# Patient Record
Sex: Female | Born: 1955 | Race: White | Hispanic: No | Smoking: Former smoker
Health system: Southern US, Community
[De-identification: ages and names within clinical notes are randomized; demographics above are authoritative.]

## PROBLEM LIST (undated history)

## (undated) DIAGNOSIS — M069 Rheumatoid arthritis, unspecified: Secondary | ICD-10-CM

## (undated) DIAGNOSIS — R030 Elevated blood-pressure reading, without diagnosis of hypertension: Secondary | ICD-10-CM

## (undated) DIAGNOSIS — E781 Pure hyperglyceridemia: Secondary | ICD-10-CM

## (undated) DIAGNOSIS — K219 Gastro-esophageal reflux disease without esophagitis: Secondary | ICD-10-CM

## (undated) DIAGNOSIS — J449 Chronic obstructive pulmonary disease, unspecified: Secondary | ICD-10-CM

## (undated) DIAGNOSIS — G629 Polyneuropathy, unspecified: Secondary | ICD-10-CM

## (undated) DIAGNOSIS — R0789 Other chest pain: Secondary | ICD-10-CM

## (undated) DIAGNOSIS — R7303 Prediabetes: Secondary | ICD-10-CM

## (undated) DIAGNOSIS — G4700 Insomnia, unspecified: Secondary | ICD-10-CM

## (undated) DIAGNOSIS — R7989 Other specified abnormal findings of blood chemistry: Secondary | ICD-10-CM

## (undated) HISTORY — PX: TONSILLECTOMY: SUR1361

## (undated) HISTORY — DX: Pure hyperglyceridemia: E78.1

## (undated) HISTORY — PX: OVARIAN CYST REMOVAL: SHX89

## (undated) HISTORY — DX: Polyneuropathy, unspecified: G62.9

## (undated) HISTORY — DX: Other chest pain: R07.89

## (undated) HISTORY — DX: Other specified abnormal findings of blood chemistry: R79.89

## (undated) HISTORY — DX: Gastro-esophageal reflux disease without esophagitis: K21.9

## (undated) HISTORY — PX: TUBAL LIGATION: SHX77

## (undated) HISTORY — DX: Elevated blood-pressure reading, without diagnosis of hypertension: R03.0

## (undated) HISTORY — DX: Prediabetes: R73.03

## (undated) HISTORY — DX: Rheumatoid arthritis, unspecified: M06.9

## (undated) HISTORY — PX: APPENDECTOMY: SHX54

## (undated) HISTORY — PX: CHOLECYSTECTOMY: SHX55

## (undated) HISTORY — DX: Chronic obstructive pulmonary disease, unspecified: J44.9

## (undated) HISTORY — DX: Insomnia, unspecified: G47.00

---

## 2017-11-20 DIAGNOSIS — J449 Chronic obstructive pulmonary disease, unspecified: Secondary | ICD-10-CM | POA: Diagnosis not present

## 2017-11-20 DIAGNOSIS — M25511 Pain in right shoulder: Secondary | ICD-10-CM | POA: Diagnosis not present

## 2017-11-20 DIAGNOSIS — J019 Acute sinusitis, unspecified: Secondary | ICD-10-CM | POA: Diagnosis not present

## 2017-11-20 DIAGNOSIS — Z0000 Encounter for general adult medical examination without abnormal findings: Secondary | ICD-10-CM | POA: Diagnosis not present

## 2018-01-08 DIAGNOSIS — Z1231 Encounter for screening mammogram for malignant neoplasm of breast: Secondary | ICD-10-CM | POA: Diagnosis not present

## 2018-01-19 DIAGNOSIS — Z86010 Personal history of colonic polyps: Secondary | ICD-10-CM | POA: Diagnosis not present

## 2018-01-19 DIAGNOSIS — J449 Chronic obstructive pulmonary disease, unspecified: Secondary | ICD-10-CM | POA: Diagnosis not present

## 2018-01-19 DIAGNOSIS — R030 Elevated blood-pressure reading, without diagnosis of hypertension: Secondary | ICD-10-CM | POA: Diagnosis not present

## 2018-01-19 DIAGNOSIS — L309 Dermatitis, unspecified: Secondary | ICD-10-CM | POA: Diagnosis not present

## 2018-03-02 DIAGNOSIS — J069 Acute upper respiratory infection, unspecified: Secondary | ICD-10-CM | POA: Diagnosis not present

## 2018-03-02 DIAGNOSIS — J441 Chronic obstructive pulmonary disease with (acute) exacerbation: Secondary | ICD-10-CM | POA: Diagnosis not present

## 2018-07-22 ENCOUNTER — Encounter: Payer: Self-pay | Admitting: Cardiology

## 2018-07-22 DIAGNOSIS — Z79899 Other long term (current) drug therapy: Secondary | ICD-10-CM | POA: Diagnosis not present

## 2018-07-22 DIAGNOSIS — R5383 Other fatigue: Secondary | ICD-10-CM | POA: Diagnosis not present

## 2018-07-22 DIAGNOSIS — R0789 Other chest pain: Secondary | ICD-10-CM | POA: Diagnosis not present

## 2018-07-22 DIAGNOSIS — R7303 Prediabetes: Secondary | ICD-10-CM | POA: Diagnosis not present

## 2018-07-22 DIAGNOSIS — Z1331 Encounter for screening for depression: Secondary | ICD-10-CM | POA: Diagnosis not present

## 2018-07-22 DIAGNOSIS — E781 Pure hyperglyceridemia: Secondary | ICD-10-CM | POA: Diagnosis not present

## 2018-07-27 DIAGNOSIS — M545 Low back pain: Secondary | ICD-10-CM | POA: Diagnosis not present

## 2018-07-27 DIAGNOSIS — Z6835 Body mass index (BMI) 35.0-35.9, adult: Secondary | ICD-10-CM | POA: Diagnosis not present

## 2018-08-07 DIAGNOSIS — K219 Gastro-esophageal reflux disease without esophagitis: Secondary | ICD-10-CM

## 2018-08-07 DIAGNOSIS — R030 Elevated blood-pressure reading, without diagnosis of hypertension: Secondary | ICD-10-CM

## 2018-08-07 DIAGNOSIS — M069 Rheumatoid arthritis, unspecified: Secondary | ICD-10-CM

## 2018-08-07 DIAGNOSIS — R0789 Other chest pain: Secondary | ICD-10-CM

## 2018-08-07 DIAGNOSIS — E781 Pure hyperglyceridemia: Secondary | ICD-10-CM

## 2018-08-07 DIAGNOSIS — R079 Chest pain, unspecified: Secondary | ICD-10-CM

## 2018-08-07 DIAGNOSIS — J449 Chronic obstructive pulmonary disease, unspecified: Secondary | ICD-10-CM

## 2018-08-07 DIAGNOSIS — G629 Polyneuropathy, unspecified: Secondary | ICD-10-CM

## 2018-08-07 DIAGNOSIS — G4700 Insomnia, unspecified: Secondary | ICD-10-CM

## 2018-08-07 DIAGNOSIS — R7303 Prediabetes: Secondary | ICD-10-CM

## 2018-08-07 HISTORY — DX: Polyneuropathy, unspecified: G62.9

## 2018-08-07 HISTORY — DX: Gastro-esophageal reflux disease without esophagitis: K21.9

## 2018-08-07 HISTORY — DX: Other chest pain: R07.89

## 2018-08-07 HISTORY — DX: Prediabetes: R73.03

## 2018-08-07 HISTORY — DX: Elevated blood-pressure reading, without diagnosis of hypertension: R03.0

## 2018-08-07 HISTORY — DX: Pure hyperglyceridemia: E78.1

## 2018-08-07 HISTORY — DX: Chronic obstructive pulmonary disease, unspecified: J44.9

## 2018-08-07 HISTORY — DX: Rheumatoid arthritis, unspecified: M06.9

## 2018-08-07 HISTORY — DX: Insomnia, unspecified: G47.00

## 2018-08-07 HISTORY — DX: Chest pain, unspecified: R07.9

## 2018-08-25 DIAGNOSIS — Z23 Encounter for immunization: Secondary | ICD-10-CM | POA: Diagnosis not present

## 2018-09-05 DIAGNOSIS — G4730 Sleep apnea, unspecified: Secondary | ICD-10-CM | POA: Diagnosis not present

## 2018-09-07 NOTE — Progress Notes (Signed)
Cardiology Office Note:    Date:  09/08/2018   ID:  Morgan Stone, DOB Mar 31, 1956, MRN [id number]  PCP:  Lonie Peak, PA-C  Cardiologist:  Norman Herrlich, MD   Referring MD: Lonie Peak, PA-C  ASSESSMENT:    1. Chest pain in adult   2. Chronic obstructive pulmonary disease, unspecified COPD type (HCC)   3. High triglycerides    PLAN:    In order of problems listed above:  1. Symptoms are atypical nonanginal but it is increased cardiovascular risk.  For further evaluation we will check troponin if abnormal she need to be treated as acute coronary syndrome and a d-dimer with a history of her sister with unprovoked venous thromboembolism.  She is at risk of lung cancer have asked for a chest x-ray reviewed the options of ischemia evaluation I think she is best suited for cardiac CTA she has normal renal function I will see her back in my office 4 weeks afterwards.  I asked her to start low-dose aspirin for cardiovascular prophylaxis and shows a statin for initiate lipid-lowering therapy with concerns of CAD.  Triglycerides remain elevated at the therapy fenofibrate or Lovaza would be appropriate 2. Severe symptomatic managed by her PCP 3. Shea therapy as a statin with concerns of CAD  Next appointment 4 weeks   Medication Adjustments/Labs and Tests Ordered: Current medicines are reviewed at length with the patient today.  Concerns regarding medicines are outlined above.  No orders of the defined types were placed in this encounter.  No orders of the defined types were placed in this encounter.    Chief Complaint  Patient presents with  . Chest Pain  . Hyperlipidemia    History of Present Illness:    Morgan Stone is a 62 y.o. female who is being seen today for the evaluation of chest pain at the request of Lonie Peak, Cordelia Poche.  She has significant underlying COPD and is short of breath literally with any activity.  For the last 6 months she has been having chest pain she  describes as a diffuse mild aching through the chest nonexertional and relieved with rest no radiation little worse with a deep breath not positional and no associated GI symptoms.  Occurs more frequently and lasts for hours and at times days and occurs almost every day.  He finally sought out medical attention.  Her cardiovascular risk factors include hyperlipidemia.  Not had a chest x-ray for many years and is a former smoker and at risk for lung cancer.  History of congenital or rheumatic heart disease.  She has had no palpitation or syncope edema orthopnea.  She relates that she had a heart murmur during pregnancy at age 72 that resolved afterwards.  Recent lipid profile from PCP office showed a cholesterol of 280 with triglycerides greater than 400.   Past Medical History:  Diagnosis Date  . Atypical chest pain 08/07/2018  . COPD (chronic obstructive pulmonary disease) (HCC) 08/07/2018  . Elevated blood pressure reading 08/07/2018  . GERD (gastroesophageal reflux disease) 08/07/2018  . High triglycerides 08/07/2018  . Insomnia 08/07/2018  . Peripheral neuropathy 08/07/2018  . Pre-diabetes 08/07/2018  . Rheumatoid arthritis (HCC) 08/07/2018    Past Surgical History:  Procedure Laterality Date  . APPENDECTOMY    . CHOLECYSTECTOMY    . OVARIAN CYST REMOVAL    . TONSILLECTOMY    . TUBAL LIGATION      Current Medications: Current Meds  Medication Sig  . omeprazole (PRILOSEC) 40 MG capsule  TAKE 1 CAPSULE BY MOUTH DAILY FOR GERD  . VENTOLIN HFA 108 (90 Base) MCG/ACT inhaler Inhale 2 puffs into the lungs every 4 (four) hours as needed.     Allergies:   Patient has no known allergies.   Social History   Socioeconomic History  . Marital status: Unknown    Spouse name: Not on file  . Number of children: Not on file  . Years of education: Not on file  . Highest education level: Not on file  Occupational History  . Not on file  Social Needs  . Financial resource strain: Not on file  .  Food insecurity:    Worry: Not on file    Inability: Not on file  . Transportation needs:    Medical: Not on file    Non-medical: Not on file  Tobacco Use  . Smoking status: Former Smoker    Last attempt to quit: 10/2010    Years since quitting: 7.9  . Smokeless tobacco: Never Used  Substance and Sexual Activity  . Alcohol use: Yes    Comment: occ beer  . Drug use: Not Currently  . Sexual activity: Not on file  Lifestyle  . Physical activity:    Days per week: Not on file    Minutes per session: Not on file  . Stress: Not on file  Relationships  . Social connections:    Talks on phone: Not on file    Gets together: Not on file    Attends religious service: Not on file    Active member of club or organization: Not on file    Attends meetings of clubs or organizations: Not on file    Relationship status: Not on file  Other Topics Concern  . Not on file  Social History Narrative  . Not on file     Family History: The patient's family history includes Arthritis in her brother; Congestive Heart Failure in her mother; Glaucoma in her brother; Hypertension in her brother; Kidney disease in her mother. There is no history of Heart attack.  ROS:   Review of Systems  Constitution: Positive for malaise/fatigue and weight gain.  HENT: Positive for hearing loss.   Eyes: Negative.   Cardiovascular: Positive for chest pain and dyspnea on exertion.  Respiratory: Positive for shortness of breath.   Endocrine: Negative.   Hematologic/Lymphatic: Bruises/bleeds easily.  Skin: Negative.   Musculoskeletal: Negative.   Gastrointestinal: Negative.   Genitourinary: Negative.   Neurological: Negative.   Psychiatric/Behavioral: Negative.   Allergic/Immunologic: Negative.    Please see the history of present illness.     All other systems reviewed and are negative.  EKGs/Labs/Other Studies Reviewed:    The following studies were reviewed today: Records reviewed from her PCP  office  EKG:  EKG is  ordered today.  The ekg ordered today demonstrates sinus rhythm and is normal  Recent Labs: No results found for requested labs within last 8760 hours.  Recent Lipid Panel No results found for: CHOL, TRIG, HDL, CHOLHDL, VLDL, LDLCALC, LDLDIRECT  Physical Exam:    VS:  BP (!) 132/92 (BP Location: Left Arm, Patient Position: Sitting, Cuff Size: Large)   Pulse 89   Ht 5' (1.524 m)   Wt 184 lb 9.6 oz (83.7 kg)   SpO2 94%   BMI 36.05 kg/m     Wt Readings from Last 3 Encounters:  09/08/18 184 lb 9.6 oz (83.7 kg)     GEN:  Well nourished, well developed in no  acute distress HEENT: Normal NECK: No JVD; No carotid bruits LYMPHATICS: No lymphadenopathy CARDIAC: RRR, no murmurs, rubs, gallops RESPIRATORY:  Clear to auscultation without rales, wheezing or rhonchi mild CCJ tenderness ABDOMEN: Soft, non-tender, non-distended MUSCULOSKELETAL:  No edema; No deformity  SKIN: Warm and dry NEUROLOGIC:  Alert and oriented x 3 PSYCHIATRIC:  Normal affect     Signed, Norman Herrlich, MD  09/08/2018 10:36 AM    [facility]

## 2018-09-08 ENCOUNTER — Ambulatory Visit: Payer: BLUE CROSS/BLUE SHIELD | Admitting: Cardiology

## 2018-09-08 ENCOUNTER — Encounter: Payer: Self-pay | Admitting: Cardiology

## 2018-09-08 VITALS — BP 132/92 | HR 89 | Ht 60.0 in | Wt 184.6 lb

## 2018-09-08 DIAGNOSIS — R0602 Shortness of breath: Secondary | ICD-10-CM | POA: Diagnosis not present

## 2018-09-08 DIAGNOSIS — E781 Pure hyperglyceridemia: Secondary | ICD-10-CM

## 2018-09-08 DIAGNOSIS — R079 Chest pain, unspecified: Secondary | ICD-10-CM | POA: Diagnosis not present

## 2018-09-08 DIAGNOSIS — J449 Chronic obstructive pulmonary disease, unspecified: Secondary | ICD-10-CM | POA: Diagnosis not present

## 2018-09-08 HISTORY — DX: Shortness of breath: R06.02

## 2018-09-08 MED ORDER — METOPROLOL TARTRATE 50 MG PO TABS: 100.0000 mg | ORAL_TABLET | Freq: Once | ORAL | 0 refills | Status: DC

## 2018-09-08 MED ORDER — ASPIRIN EC 81 MG PO TBEC: 81.0000 mg | DELAYED_RELEASE_TABLET | Freq: Every day | ORAL | 3 refills | Status: DC

## 2018-09-08 MED ORDER — PRAVASTATIN SODIUM 20 MG PO TABS: 20.0000 mg | ORAL_TABLET | Freq: Every evening | ORAL | 3 refills | Status: DC

## 2018-09-08 NOTE — Patient Instructions (Addendum)
Medication Instructions:  Your physician has recommended you make the following change in your medication:   START coated aspirin 81 mg: Take 1 tablet daily START pravastatin (pravachol) 20 mg: Take 1 tablet daily in the evening  If you need a refill on your cardiac medications before your next appointment, please call your pharmacy.   Lab work: Your physician recommends that you return for lab work today: Troponin I, D-dimer. Your physician recommends that you return for lab work within 3-7 days before your cardiac CTA: BMP. No appointment needed, no need to fast.    If you have labs (blood work) drawn today and your tests are completely normal, you will receive your results only by: Marland Kitchen MyChart Message (if you have MyChart) OR . A paper copy in the mail If you have any lab test that is abnormal or we need to change your treatment, we will call you to review the results.  Testing/Procedures: You had an EKG today.  A chest x-ray takes a picture of the organs and structures inside the chest, including the heart, lungs, and blood vessels. This test can show several things, including, whether the heart is enlarges; whether fluid is building up in the lungs; and whether pacemaker / defibrillator leads are still in place.  Your physician has requested that you have cardiac CT. Cardiac computed tomography (CT) is a painless test that uses an x-ray machine to take clear, detailed pictures of your heart. For further information please visit [email or web address] Please follow instruction sheet as given.   Please arrive at the [facility] main entrance of [facility] at xx:xx AM (30-45 minutes prior to test start time)  [facility] [address] [place], [place] [address] [phone]  Proceed to the [facility] Radiology Department (First Floor).  Please follow these instructions carefully (unless otherwise directed):  On the Night Before the Test: . Be sure to Drink  plenty of water. . Do not consume any caffeinated/decaffeinated beverages or chocolate 12 hours prior to your test. . Do not take any antihistamines 12 hours prior to your test.  On the Day of the Test: . Drink plenty of water. Do not drink any water within one hour of the test. . Do not eat any food 4 hours prior to the test. . You may take your regular medications prior to the test.  . Take metoprolol (Lopressor) two hours prior to test.                  -If HR is less than 55 BPM- No Beta Blocker                -IF HR is greater than 55 BPM and patient is less than or equal to 56 yrs old Lopressor 100mg  x1.                -If HR is greater than 55 BPM and patient is greater than 8 yrs old Lopressor 50 mg x1.          After the Test: . Drink plenty of water. . After receiving IV contrast, you may experience a mild flushed feeling. This is normal. . On occasion, you may experience a mild rash up to 24 hours after the test. This is not dangerous. If this occurs, you can take Benadryl 25 mg and increase your fluid intake. . If you experience trouble breathing, this can be serious. If it is severe call 911 IMMEDIATELY. If it is  mild, please call our office.  Follow-Up: At [facility], you and your health needs are our priority.  As part of our continuing mission to provide you with exceptional heart care, we have created designated Provider Care Teams.  These Care Teams include your primary Cardiologist (physician) and Advanced Practice Providers (APPs -  Physician Assistants and Nurse Practitioners) who all work together to provide you with the care you need, when you need it. You will need a follow up appointment in 4 weeks.       Aspirin and Your Heart Aspirin is a medicine that affects the way blood clots. Aspirin can be used to help reduce the risk of blood clots, heart attacks, and other heart-related problems. Should I take aspirin? Your health care provider will help you  determine whether it is safe and beneficial for you to take aspirin daily. Taking aspirin daily may be beneficial if you:  Have had a heart attack or chest pain.  Have undergone open heart surgery such as coronary artery bypass surgery (CABG).  Have had coronary angioplasty.  Have experienced a stroke or transient ischemic attack (TIA).  Have peripheral vascular disease (PVD).  Have chronic heart rhythm problems such as atrial fibrillation.  Are there any risks of taking aspirin daily? Daily use of aspirin can increase your risk of side effects. Some of these include:  Bleeding. Bleeding problems can be minor or serious. An example of a minor problem is a cut that does not stop bleeding. An example of a more serious problem is stomach bleeding or bleeding into the brain. Your risk of bleeding is increased if you are also taking non-steroidal anti-inflammatory medicine (NSAIDs).  Increased bruising.  Upset stomach.  An allergic reaction. People who have nasal polyps have an increased risk of developing an aspirin allergy.  What are some guidelines I should follow when taking aspirin?  Take aspirin only as directed by your health care provider. Make sure you understand how much you should take and what form you should take. The two forms of aspirin are: ? Non-enteric-coated. This type of aspirin does not have a coating and is absorbed quickly. Non-enteric-coated aspirin is usually recommended for people with chest pain. This type of aspirin also comes in a chewable form. ? Enteric-coated. This type of aspirin has a special coating that releases the medicine very slowly. Enteric-coated aspirin causes less stomach upset than non-enteric-coated aspirin. This type of aspirin should not be chewed or crushed.  Drink alcohol in moderation. Drinking alcohol increases your risk of bleeding. When should I seek medical care?  You have unusual bleeding or bruising.  You have stomach  pain.  You have an allergic reaction. Symptoms of an allergic reaction include: ? Hives. ? Itchy skin. ? Swelling of the lips, tongue, or face.  You have ringing in your ears. When should I seek immediate medical care?  Your bowel movements are bloody, dark red, or black in color.  You vomit or cough up blood.  You have blood in your urine.  You cough, wheeze, or feel short of breath. If you have any of the following symptoms, this is an emergency. Do not wait to see if the pain will go away. Get medical help at once. Call your local emergency services (911 in the [place] Do not drive yourself to the hospital.  You have severe chest pain, especially if the pain is crushing or pressure-like and spreads to the arms, back, neck, or jaw.  You have stroke-like  symptoms, such as: ? Loss of vision. ? Difficulty talking. ? Numbness or weakness on one side of your body. ? Numbness or weakness in your arm or leg. ? Not thinking clearly or feeling confused.  This information is not intended to replace advice given to you by your health care provider. Make sure you discuss any questions you have with your health care provider. Document Released: 10/03/2008 Document Revised: 02/28/2016 Document Reviewed: 01/26/2014 Elsevier Interactive Patient Education  2018 [employer].    Pravastatin tablets What is this medicine? PRAVASTATIN (PRA va stat in) is known as a HMG-CoA reductase inhibitor or 'statin'. It lowers the level of cholesterol and triglycerides in the blood. This drug may also reduce the risk of heart attack, stroke, or other health problems in patients with risk factors for heart disease. Diet and lifestyle changes are often used with this drug. This medicine may be used for other purposes; ask your health care provider or pharmacist if you have questions. COMMON BRAND NAME(S): Pravachol What should I tell my health care provider before I take this medicine? They need to know if  you have any of these conditions: -frequently drink alcoholic beverages -kidney disease -liver disease -muscle aches or weakness -other medical condition -an unusual or allergic reaction to pravastatin, other medicines, foods, dyes, or preservatives -pregnant or trying to get pregnant -breast-feeding How should I use this medicine? Take pravastatin tablets by mouth. Swallow the tablets with a drink of water. Pravastatin can be taken at anytime of the day, with or without food. Follow the directions on the prescription label. Take your doses at regular intervals. Do not take your medicine more often than directed. Talk to your pediatrician regarding the use of this medicine in children. Special care may be needed. Pravastatin has been used in children as young as 69 years of age. Overdosage: If you think you have taken too much of this medicine contact a poison control center or emergency room at once. NOTE: This medicine is only for you. Do not share this medicine with others. What if I miss a dose? If you miss a dose, take it as soon as you can. If it is almost time for your next dose, take only that dose. Do not take double or extra doses. What may interact with this medicine? This medicine may interact with the following medications: -colchicine -cyclosporine -other medicines for high cholesterol -some antibiotics like azithromycin, clarithromycin, erythromycin, and telithromycin This list may not describe all possible interactions. Give your health care provider a list of all the medicines, herbs, non-prescription drugs, or dietary supplements you use. Also tell them if you smoke, drink alcohol, or use illegal drugs. Some items may interact with your medicine. What should I watch for while using this medicine? Visit your doctor or health care professional for regular check-ups. You may need regular tests to make sure your liver is working properly. Tell your doctor or health care  professional right away if you get any unexplained muscle pain, tenderness, or weakness, especially if you also have a fever and tiredness. Your doctor or health care professional may tell you to stop taking this medicine if you develop muscle problems. If your muscle problems do not go away after stopping this medicine, contact your health care professional. This drug is only part of a total heart-health program. Your doctor or a dietician can suggest a low-cholesterol and low-fat diet to help. Avoid alcohol and smoking, and keep a proper exercise schedule. Do not use  this drug if you are pregnant or breast-feeding. Serious side effects to an unborn child or to an infant are possible. Talk to your doctor or pharmacist for more information. This medicine may affect blood sugar levels. If you have diabetes, check with your doctor or health care professional before you change your diet or the dose of your diabetic medicine. If you are going to have surgery tell your health care professional that you are taking this drug. What side effects may I notice from receiving this medicine? Side effects that you should report to your doctor or health care professional as soon as possible: -allergic reactions like skin rash, itching or hives, swelling of the face, lips, or tongue -dark urine -fever -muscle pain, cramps, or weakness -redness, blistering, peeling or loosening of the skin, including inside the mouth -trouble passing urine or change in the amount of urine -unusually weak or tired -yellowing of the eyes or skin Side effects that usually do not require medical attention (report to your doctor or health care professional if they continue or are bothersome): -gas -headache -heartburn -indigestion -stomach pain This list may not describe all possible side effects. Call your doctor for medical advice about side effects. You may report side effects to FDA at 1-800-FDA-1088. Where should I keep my  medicine? Keep out of the reach of children. Store at room temperature between 15 to 30 degrees C (59 to 86 degrees F). Protect from light. Keep container tightly closed. Throw away any unused medicine after the expiration date. NOTE: This sheet is a summary. It may not cover all possible information. If you have questions about this medicine, talk to your doctor, pharmacist, or health care provider.  2018 Elsevier/Gold Standard (2015-05-18 16:00:27)     Cardiac CT Angiogram A cardiac CT angiogram is a procedure to look at the heart and the area around the heart. It may be done to help find the cause of chest pains or other symptoms of heart disease. During this procedure, a large X-ray machine, called a CT scanner, takes detailed pictures of the heart and the surrounding area after a dye (contrast material) has been injected into blood vessels in the area. The procedure is also sometimes called a coronary CT angiogram, coronary artery scanning, or CTA. A cardiac CT angiogram allows the health care provider to see how well blood is flowing to and from the heart. The health care provider will be able to see if there are any problems, such as:  Blockage or narrowing of the coronary arteries in the heart.  Fluid around the heart.  Signs of weakness or disease in the muscles, valves, and tissues of the heart.  Tell a health care provider about:  Any allergies you have. This is especially important if you have had a previous allergic reaction to contrast dye.  All medicines you are taking, including vitamins, herbs, eye drops, creams, and over-the-counter medicines.  Any blood disorders you have.  Any surgeries you have had.  Any medical conditions you have.  Whether you are pregnant or may be pregnant.  Any anxiety disorders, chronic pain, or other conditions you have that may increase your stress or prevent you from lying still. What are the risks? Generally, this is a safe  procedure. However, problems may occur, including:  Bleeding.  Infection.  Allergic reactions to medicines or dyes.  Damage to other structures or organs.  Kidney damage from the dye or contrast that is used.  Increased risk of cancer from  radiation exposure. This risk is low. Talk with your health care provider about: ? The risks and benefits of testing. ? How you can receive the lowest dose of radiation.  What happens before the procedure?  Wear comfortable clothing and remove any jewelry, glasses, dentures, and hearing aids.  Follow instructions from your health care provider about eating and drinking. This may include: ? For 12 hours before the test - avoid caffeine. This includes tea, coffee, soda, energy drinks, and diet pills. Drink plenty of water or other fluids that do not have caffeine in them. Being well-hydrated can prevent complications. ? For 4-6 hours before the test - stop eating and drinking. The contrast dye can cause nausea, but this is less likely if your stomach is empty.  Ask your health care provider about changing or stopping your regular medicines. This is especially important if you are taking diabetes medicines, blood thinners, or medicines to treat erectile dysfunction. What happens during the procedure?  Hair on your chest may need to be removed so that small sticky patches called electrodes can be placed on your chest. These will transmit information that helps to monitor your heart during the test.  An IV tube will be inserted into one of your veins.  You might be given a medicine to control your heart rate during the test. This will help to ensure that good images are obtained.  You will be asked to lie on an exam table. This table will slide in and out of the CT machine during the procedure.  Contrast dye will be injected into the IV tube. You might feel warm, or you may get a metallic taste in your mouth.  You will be given a medicine  (nitroglycerin) to relax (dilate) the arteries in your heart.  The table that you are lying on will move into the CT machine tunnel for the scan.  The person running the machine will give you instructions while the scans are being done. You may be asked to: ? Keep your arms above your head. ? Hold your breath. ? Stay very still, even if the table is moving.  When the scanning is complete, you will be moved out of the machine.  The IV tube will be removed. The procedure may vary among health care providers and hospitals. What happens after the procedure?  You might feel warm, or you may get a metallic taste in your mouth from the contrast dye.  You may have a headache from the nitroglycerin.  After the procedure, drink water or other fluids to wash (flush) the contrast material out of your body.  Contact a health care provider if you have any symptoms of allergy to the contrast. These symptoms include: ? Shortness of breath. ? Rash or hives. ? A racing heartbeat.  Most people can return to their normal activities right after the procedure. Ask your health care provider what activities are safe for you.  It is up to you to get the results of your procedure. Ask your health care provider, or the department that is doing the procedure, when your results will be ready. Summary  A cardiac CT angiogram is a procedure to look at the heart and the area around the heart. It may be done to help find the cause of chest pains or other symptoms of heart disease.  During this procedure, a large X-ray machine, called a CT scanner, takes detailed pictures of the heart and the surrounding area after a dye (contrast  material) has been injected into blood vessels in the area.  Ask your health care provider about changing or stopping your regular medicines before the procedure. This is especially important if you are taking diabetes medicines, blood thinners, or medicines to treat erectile  dysfunction.  After the procedure, drink water or other fluids to wash (flush) the contrast material out of your body. This information is not intended to replace advice given to you by your health care provider. Make sure you discuss any questions you have with your health care provider. Document Released: 10/03/2008 Document Revised: 09/09/2016 Document Reviewed: 09/09/2016 Elsevier Interactive Patient Education  2017 [employer].

## 2018-09-11 ENCOUNTER — Telehealth: Payer: Self-pay | Admitting: *Deleted

## 2018-09-11 ENCOUNTER — Encounter: Payer: Self-pay | Admitting: *Deleted

## 2018-09-11 DIAGNOSIS — R9389 Abnormal findings on diagnostic imaging of other specified body structures: Secondary | ICD-10-CM | POA: Insufficient documentation

## 2018-09-11 NOTE — Telephone Encounter (Signed)
Patient informed of chest x-ray results and is agreeable to have a CT chest w/o contrast. Advised patient after insurance approval is processed, she will be contacted to schedule testing. Patient verbalized understanding. No further questions.

## 2018-09-11 NOTE — Telephone Encounter (Signed)
-----   Message from Baldo Daub, MD sent at 09/10/2018  8:17 AM EST ----- Abnormal needs non contrast CT chest

## 2018-09-17 ENCOUNTER — Telehealth: Payer: Self-pay

## 2018-09-17 LAB — SPECIMEN STATUS REPORT

## 2018-09-17 NOTE — Telephone Encounter (Signed)
Entered in error

## 2018-09-18 ENCOUNTER — Telehealth: Payer: Self-pay | Admitting: Cardiology

## 2018-09-18 DIAGNOSIS — R7989 Other specified abnormal findings of blood chemistry: Secondary | ICD-10-CM

## 2018-09-18 HISTORY — DX: Other specified abnormal findings of blood chemistry: R79.89

## 2018-09-18 LAB — D-DIMER, QUANTITATIVE: D-DIMER: 0.74 mg/L FEU — ABNORMAL HIGH (ref 0.00–0.49)

## 2018-09-18 LAB — TROPONIN I

## 2018-09-18 NOTE — Addendum Note (Signed)
Addended by: Lamona Curl on: 09/18/2018 03:12 PM   Modules accepted: Orders

## 2018-09-18 NOTE — Telephone Encounter (Signed)
Advised patient that she will no longer need CT chest as she will have that done during Cardiac CTA on 09-28-18.    Patient notified of elevated d dimer per Dr Dulce Sellar.  Pt is scheduled for VQ scan at [facility] on 09-21-18.  Patient verbalized understanding.

## 2018-09-18 NOTE — Telephone Encounter (Signed)
Patient states that she was told that she can get her scan done cheaper at the Imaging center and would like to Schedule there and we can go ahead and schedule for her.Marland Kitchen

## 2018-09-18 NOTE — Telephone Encounter (Signed)
Left message for patient to return call.

## 2018-09-21 DIAGNOSIS — R0602 Shortness of breath: Secondary | ICD-10-CM | POA: Diagnosis not present

## 2018-09-21 DIAGNOSIS — R079 Chest pain, unspecified: Secondary | ICD-10-CM | POA: Diagnosis not present

## 2018-09-21 DIAGNOSIS — R7989 Other specified abnormal findings of blood chemistry: Secondary | ICD-10-CM | POA: Diagnosis not present

## 2018-09-22 LAB — BASIC METABOLIC PANEL
BUN / CREAT RATIO: 13 (ref 12–28)
BUN: 11 mg/dL (ref 8–27)
CO2: 19 mmol/L — AB (ref 20–29)
CREATININE: 0.86 mg/dL (ref 0.57–1.00)
Calcium: 9.9 mg/dL (ref 8.7–10.3)
Chloride: 104 mmol/L (ref 96–106)
GFR calc Af Amer: 83 mL/min/{1.73_m2} (ref >59)
GFR calc non Af Amer: 72 mL/min/{1.73_m2} (ref >59)
GLUCOSE: 111 mg/dL — AB (ref 65–99)
POTASSIUM: 3.9 mmol/L (ref 3.5–5.2)
SODIUM: 143 mmol/L (ref 134–144)

## 2018-09-25 ENCOUNTER — Other Ambulatory Visit: Payer: Self-pay | Admitting: *Deleted

## 2018-09-25 ENCOUNTER — Telehealth: Payer: Self-pay | Admitting: Cardiology

## 2018-09-25 DIAGNOSIS — R7989 Other specified abnormal findings of blood chemistry: Secondary | ICD-10-CM

## 2018-09-28 ENCOUNTER — Ambulatory Visit (HOSPITAL_COMMUNITY): Payer: BLUE CROSS/BLUE SHIELD

## 2018-09-28 ENCOUNTER — Ambulatory Visit (HOSPITAL_COMMUNITY)
Admission: RE | Admit: 2018-09-28 | Discharge: 2018-09-28 | Disposition: A | Discharge status: Home / Self Care | Payer: BLUE CROSS/BLUE SHIELD | Source: Ambulatory Visit | Attending: Cardiology | Admitting: Cardiology

## 2018-09-28 ENCOUNTER — Encounter (HOSPITAL_COMMUNITY): Payer: Self-pay

## 2018-09-28 DIAGNOSIS — R079 Chest pain, unspecified: Secondary | ICD-10-CM | POA: Insufficient documentation

## 2018-09-28 MED ORDER — NITROGLYCERIN 0.4 MG SL SUBL
0.8000 mg | SUBLINGUAL_TABLET | Freq: Once | SUBLINGUAL | Status: AC
  Administered 2018-09-28: 0.8 mg via SUBLINGUAL

## 2018-09-28 MED ORDER — IOPAMIDOL (ISOVUE-370) INJECTION 76%
80.0000 mL | Freq: Once | INTRAVENOUS | Status: AC | PRN
  Administered 2018-09-28: 80 mL via INTRAVENOUS

## 2018-09-28 MED ORDER — METOPROLOL TARTRATE 5 MG/5ML IV SOLN
5.0000 mg | INTRAVENOUS | Status: DC | PRN
  Administered 2018-09-28: 5 mg via INTRAVENOUS

## 2018-09-28 MED ORDER — NITROGLYCERIN 0.4 MG SL SUBL
SUBLINGUAL_TABLET | SUBLINGUAL | Status: AC
  Administered 2018-09-28: 0.8 mg via SUBLINGUAL
  Filled 2018-09-28: qty 2

## 2018-09-28 MED ORDER — METOPROLOL TARTRATE 5 MG/5ML IV SOLN
INTRAVENOUS | Status: AC
  Administered 2018-09-28: 5 mg via INTRAVENOUS
  Filled 2018-09-28: qty 5

## 2018-09-29 ENCOUNTER — Telehealth: Payer: Self-pay

## 2018-09-29 NOTE — Telephone Encounter (Signed)
Patient notified of normal with no CAD seen on cardiac CTA per Dr Dulce Sellar.  Patient verbalized understanding.

## 2018-10-05 NOTE — Telephone Encounter (Signed)
Done

## 2018-10-07 NOTE — Progress Notes (Signed)
Cardiology Office Note:    Date:  10/08/2018   ID:  Morgan Stone, DOB [birth year], MRN [id number]  PCP:  Lonie Peak, PA-C  Cardiologist:  Norman Herrlich, MD    Referring MD: Lonie Peak, PA-C    ASSESSMENT:    1. Chronic obstructive pulmonary disease, unspecified COPD type (HCC)   2. Chest pain in adult   3. Costochondritis    PLAN:    In order of problems listed above:  1. By her primary care physician continue current treatment 2. After extensive evaluation no evidence of heart underlying heart disease or pulmonary embolism.  Clinically she has chronic costochondral playing a place her nonsteroidal anti-inflammatory drug and if unimproved referral to physical therapy modalities iontophoresis of high potency steroid to the chest wall she is reassured.   Next appointment: As needed   Medication Adjustments/Labs and Tests Ordered: Current medicines are reviewed at length with the patient today.  Concerns regarding medicines are outlined above.  No orders of the defined types were placed in this encounter.  Meds ordered this encounter  Medications  . celecoxib (CELEBREX) 100 MG capsule    Sig: Take 1 capsule (100 mg total) by mouth 2 (two) times daily.    Dispense:  30 capsule    Refill:  1    No chief complaint on file.   History of Present Illness:    Morgan Stone is a 62 y.o. female with a hx of COPD hyperlipidemia and chest pain last seen 09/08/18 for chest pain.D Dimer was normal for age and Troponin undetectable.cardiac CTA was normal. Perfusion lung scan was normal.  IMPRESSION: 1. Coronary calcium score of 0.72. This was 57th percentile for age and sex matched control. 2. Normal coronary origin with right dominance. 3. No evidence of obstructive CAD. Compliance with diet, lifestyle and medications: Yes  She continues to have sternal pain she describes it as ache but has a positional component is reproducible palpating costochondral junction bilaterally.   Clinically after extensive evaluation she has chronic costochondral pain.  She is reassured.  She also has reflux well controlled with PPI and I will place her on a nonsteroidal anti-inflammatory drug to break her pain cycle and if ineffective referral to physical therapy modalities.  COPD is stable she has no severe shortness of breath no exertional chest pain palpitation or syncope. Past Medical History:  Diagnosis Date  . Atypical chest pain 08/07/2018  . COPD (chronic obstructive pulmonary disease) (HCC) 08/07/2018  . Elevated blood pressure reading 08/07/2018  . Elevated d-dimer 09/18/2018  . GERD (gastroesophageal reflux disease) 08/07/2018  . High triglycerides 08/07/2018  . Insomnia 08/07/2018  . Peripheral neuropathy 08/07/2018  . Pre-diabetes 08/07/2018  . Rheumatoid arthritis (HCC) 08/07/2018    Past Surgical History:  Procedure Laterality Date  . APPENDECTOMY    . CHOLECYSTECTOMY    . OVARIAN CYST REMOVAL    . TONSILLECTOMY    . TUBAL LIGATION      Current Medications: Current Meds  Medication Sig  . aspirin EC 81 MG tablet Take 1 tablet (81 mg total) by mouth daily.  . meloxicam (MOBIC) 15 MG tablet TAKE 1 TABLET BY MOUTH EVERY DAY FOR BACK PAIN  . omeprazole (PRILOSEC) 40 MG capsule TAKE 1 CAPSULE BY MOUTH DAILY FOR GERD  . VENTOLIN HFA 108 (90 Base) MCG/ACT inhaler Inhale 2 puffs into the lungs every 4 (four) hours as needed.     Allergies:   Patient has no known allergies.   Social History  Socioeconomic History  . Marital status: Divorced    Spouse name: Not on file  . Number of children: Not on file  . Years of education: Not on file  . Highest education level: Not on file  Occupational History  . Not on file  Social Needs  . Financial resource strain: Not on file  . Food insecurity:    Worry: Not on file    Inability: Not on file  . Transportation needs:    Medical: Not on file    Non-medical: Not on file  Tobacco Use  . Smoking status: Former Smoker      Last attempt to quit: 10/2010    Years since quitting: 8.0  . Smokeless tobacco: Never Used  Substance and Sexual Activity  . Alcohol use: Yes    Comment: occ beer  . Drug use: Not Currently  . Sexual activity: Not on file  Lifestyle  . Physical activity:    Days per week: Not on file    Minutes per session: Not on file  . Stress: Not on file  Relationships  . Social connections:    Talks on phone: Not on file    Gets together: Not on file    Attends religious service: Not on file    Active member of club or organization: Not on file    Attends meetings of clubs or organizations: Not on file    Relationship status: Not on file  Other Topics Concern  . Not on file  Social History Narrative  . Not on file     Family History: The patient's family history includes Arthritis in her brother; Congestive Heart Failure in her mother; Deep vein thrombosis in her sister; Glaucoma in her brother; Hypertension in her brother; Kidney disease in her mother. There is no history of Heart attack. ROS:   Please see the history of present illness.    All other systems reviewed and are negative.  EKGs/Labs/Other Studies Reviewed:    The following studies were reviewed today:  Recent Labs: 09/21/2018: BUN 11; Creatinine, Ser 0.86; Potassium 3.9; Sodium 143  Recent Lipid Panel No results found for: CHOL, TRIG, HDL, CHOLHDL, VLDL, LDLCALC, LDLDIRECT  Physical Exam:    VS:  BP 134/84 (BP Location: Right Arm, Patient Position: Sitting, Cuff Size: Large)   Pulse 88   Ht 5' (1.524 m)   Wt 182 lb (82.6 kg)   SpO2 97%   BMI 35.54 kg/m     Wt Readings from Last 3 Encounters:  10/08/18 182 lb (82.6 kg)  09/08/18 184 lb 9.6 oz (83.7 kg)     GEN:  Well nourished, well developed in no acute distress HEENT: Normal NECK: No JVD; No carotid bruits LYMPHATICS: No lymphadenopathy CARDIAC: tender CCJ bilaterally tender reproduces her complaint RRR, no murmurs, rubs, gallops RESPIRATORY:   Clear to auscultation without rales, wheezing or rhonchi  ABDOMEN: Soft, non-tender, non-distended MUSCULOSKELETAL:  No edema; No deformity  SKIN: Warm and dry NEUROLOGIC:  Alert and oriented x 3 PSYCHIATRIC:  Normal affect    Signed, Norman Herrlich, MD  10/08/2018 2:45 PM    [facility]

## 2018-10-08 ENCOUNTER — Ambulatory Visit (INDEPENDENT_AMBULATORY_CARE_PROVIDER_SITE_OTHER): Payer: BLUE CROSS/BLUE SHIELD | Admitting: Cardiology

## 2018-10-08 ENCOUNTER — Encounter: Payer: Self-pay | Admitting: Cardiology

## 2018-10-08 VITALS — BP 134/84 | HR 88 | Ht 60.0 in | Wt 182.0 lb

## 2018-10-08 DIAGNOSIS — J449 Chronic obstructive pulmonary disease, unspecified: Secondary | ICD-10-CM

## 2018-10-08 DIAGNOSIS — R079 Chest pain, unspecified: Secondary | ICD-10-CM | POA: Diagnosis not present

## 2018-10-08 DIAGNOSIS — M940 Chondrocostal junction syndrome [Tietze]: Secondary | ICD-10-CM | POA: Diagnosis not present

## 2018-10-08 HISTORY — DX: Chondrocostal junction syndrome (tietze): M94.0

## 2018-10-08 MED ORDER — CELECOXIB 100 MG PO CAPS: 100.0000 mg | ORAL_CAPSULE | Freq: Two times a day (BID) | ORAL | 1 refills | Status: DC

## 2018-10-08 NOTE — Patient Instructions (Addendum)
Medication Instructions:  Your physician has recommended you make the following change in your medication:  START celecoxib (celebrex) 100 mg: Take 1 tablet twice daily for 2 weeks. If needed, you can take for another 2 weeks. If this medication does not help, please contact our office for further recommendations.   If you need a refill on your cardiac medications before your next appointment, please call your pharmacy.   Lab work: None  If you have labs (blood work) drawn today and your tests are completely normal, you will receive your results only by: Marland Kitchen MyChart Message (if you have MyChart) OR . A paper copy in the mail If you have any lab test that is abnormal or we need to change your treatment, we will call you to review the results.  Testing/Procedures: None  Follow-Up: At [facility], you and your health needs are our priority.  As part of our continuing mission to provide you with exceptional heart care, we have created designated Provider Care Teams.  These Care Teams include your primary Cardiologist (physician) and Advanced Practice Providers (APPs -  Physician Assistants and Nurse Practitioners) who all work together to provide you with the care you need, when you need it. You will need a follow up appointment as needed if symptoms worsen or fail to improve.        Costochondritis Costochondritis is swelling and irritation (inflammation) of the tissue (cartilage) that connects your ribs to your breastbone (sternum). This causes pain in the front of your chest. Usually, the pain:  Starts gradually.  Is in more than one rib.  This condition usually goes away on its own over time. Follow these instructions at home:  Do not do anything that makes your pain worse.  If directed, put ice on the painful area: ? Put ice in a plastic bag. ? Place a towel between your skin and the bag. ? Leave the ice on for 20 minutes, 2-3 times a day.  If directed, put heat on the  affected area as often as told by your doctor. Use the heat source that your doctor tells you to use, such as a moist heat pack or a heating pad. ? Place a towel between your skin and the heat source. ? Leave the heat on for 20-30 minutes. ? Take off the heat if your skin turns bright red. This is very important if you cannot feel pain, heat, or cold. You may have a greater risk of getting burned.  Take over-the-counter and prescription medicines only as told by your doctor.  Return to your normal activities as told by your doctor. Ask your doctor what activities are safe for you.  Keep all follow-up visits as told by your doctor. This is important. Contact a doctor if:  You have chills or a fever.  Your pain does not go away or it gets worse.  You have a cough that does not go away. Get help right away if:  You are short of breath. This information is not intended to replace advice given to you by your health care provider. Make sure you discuss any questions you have with your health care provider. Document Released: 04/08/2008 Document Revised: 05/10/2016 Document Reviewed: 02/14/2016 Elsevier Interactive Patient Education  2018 [employer].     Celecoxib capsules What is this medicine? CELECOXIB (sell a KOX ib) is a non-steroidal anti-inflammatory drug (NSAID). This medicine is used to treat arthritis and ankylosing spondylitis. It may be also used for pain or  painful monthly periods. This medicine may be used for other purposes; ask your health care provider or pharmacist if you have questions. COMMON BRAND NAME(S): Celebrex What should I tell my health care provider before I take this medicine? They need to know if you have any of these conditions: -asthma -coronary artery bypass graft (CABG) surgery within the past 2 weeks -drink more than 3 alcohol-containing drinks a day -heart disease or circulation problems like heart failure or leg edema (fluid retention) -high  blood pressure -kidney disease -liver disease -stomach bleeding or ulcers -an unusual or allergic reaction to celecoxib, sulfa drugs, aspirin, other NSAIDs, other medicines, foods, dyes, or preservatives -pregnant or trying to get pregnant -breast-feeding How should I use this medicine? Take this medicine by mouth with a full glass of water. Follow the directions on the prescription label. Take it with food if it upsets your stomach or if you take 400 mg at one time. Try to not lie down for at least 10 minutes after you take the medicine. Take the medicine at the same time each day. Do not take more medicine than you are told to take. Long-term, continuous use may increase the risk of heart attack or stroke. A special MedGuide will be given to you by the pharmacist with each prescription and refill. Be sure to read this information carefully each time. Talk to your pediatrician regarding the use of this medicine in children. Special care may be needed. Overdosage: If you think you have taken too much of this medicine contact a poison control center or emergency room at once. NOTE: This medicine is only for you. Do not share this medicine with others. What if I miss a dose? If you miss a dose, take it as soon as you can. If it is almost time for your next dose, take only that dose. Do not take double or extra doses. What may interact with this medicine? Do not take this medicine with any of the following medications: -cidofovir -methotrexate -other NSAIDs, medicines for pain and inflammation, like ibuprofen or naproxen -pemetrexed This medicine may also interact with the following medications: -alcohol -aspirin and aspirin-like drugs -diuretics -fluconazole -lithium -medicines for high blood pressure -steroid medicines like prednisone or cortisone -warfarin This list may not describe all possible interactions. Give your health care provider a list of all the medicines, herbs,  non-prescription drugs, or dietary supplements you use. Also tell them if you smoke, drink alcohol, or use illegal drugs. Some items may interact with your medicine. What should I watch for while using this medicine? Tell your doctor or health care professional if your pain does not get better. Talk to your doctor before taking another medicine for pain. Do not treat yourself. This medicine does not prevent heart attack or stroke. In fact, this medicine may increase the chance of a heart attack or stroke. The chance may increase with longer use of this medicine and in people who have heart disease. If you take aspirin to prevent heart attack or stroke, talk with your doctor or health care professional. Do not take medicines such as ibuprofen and naproxen with this medicine. Side effects such as stomach upset, nausea, or ulcers may be more likely to occur. Many medicines available without a prescription should not be taken with this medicine. This medicine can cause ulcers and bleeding in the stomach and intestines at any time during treatment. Ulcers and bleeding can happen without warning symptoms and can cause death. What side effects may  I notice from receiving this medicine? Side effects that you should report to your doctor or health care professional as soon as possible: -allergic reactions like skin rash, itching or hives, swelling of the face, lips, or tongue -black or bloody stools, blood in the urine or vomit -blurred vision -breathing problems -chest pain -nausea, vomiting -problems with balance, talking, walking -redness, blistering, peeling or loosening of the skin, including inside the mouth -unexplained weight gain or swelling -unusually weak or tired -yellowing of eyes, skin Side effects that usually do not require medical attention (report to your doctor or health care professional if they continue or are bothersome): -constipation or diarrhea -dizziness -gas or  heartburn -upset stomach This list may not describe all possible side effects. Call your doctor for medical advice about side effects. You may report side effects to FDA at 1-800-FDA-1088. Where should I keep my medicine? Keep out of the reach of children. Store at room temperature between 15 and 30 degrees C (59 and 86 degrees F). Keep container tightly closed. Throw away any unused medicine after the expiration date. NOTE: This sheet is a summary. It may not cover all possible information. If you have questions about this medicine, talk to your doctor, pharmacist, or health care provider.  2018 Elsevier/Gold Standard (2009-12-20 10:54:17)

## 2018-10-14 DIAGNOSIS — G4733 Obstructive sleep apnea (adult) (pediatric): Secondary | ICD-10-CM | POA: Diagnosis not present

## 2018-11-14 DIAGNOSIS — G4733 Obstructive sleep apnea (adult) (pediatric): Secondary | ICD-10-CM | POA: Diagnosis not present

## 2018-11-27 ENCOUNTER — Other Ambulatory Visit: Payer: Self-pay | Admitting: Cardiology

## 2018-12-15 DIAGNOSIS — G4733 Obstructive sleep apnea (adult) (pediatric): Secondary | ICD-10-CM | POA: Diagnosis not present

## 2019-01-13 DIAGNOSIS — G4733 Obstructive sleep apnea (adult) (pediatric): Secondary | ICD-10-CM | POA: Diagnosis not present

## 2019-01-26 DIAGNOSIS — J069 Acute upper respiratory infection, unspecified: Secondary | ICD-10-CM | POA: Diagnosis not present

## 2019-01-26 DIAGNOSIS — J449 Chronic obstructive pulmonary disease, unspecified: Secondary | ICD-10-CM | POA: Diagnosis not present

## 2019-02-13 DIAGNOSIS — G4733 Obstructive sleep apnea (adult) (pediatric): Secondary | ICD-10-CM | POA: Diagnosis not present

## 2019-03-05 DIAGNOSIS — E781 Pure hyperglyceridemia: Secondary | ICD-10-CM | POA: Diagnosis not present

## 2019-03-05 DIAGNOSIS — Z79899 Other long term (current) drug therapy: Secondary | ICD-10-CM | POA: Diagnosis not present

## 2019-03-08 DIAGNOSIS — J449 Chronic obstructive pulmonary disease, unspecified: Secondary | ICD-10-CM | POA: Diagnosis not present

## 2019-03-08 DIAGNOSIS — K219 Gastro-esophageal reflux disease without esophagitis: Secondary | ICD-10-CM | POA: Diagnosis not present

## 2019-03-08 DIAGNOSIS — R7303 Prediabetes: Secondary | ICD-10-CM | POA: Diagnosis not present

## 2019-03-08 DIAGNOSIS — E781 Pure hyperglyceridemia: Secondary | ICD-10-CM | POA: Diagnosis not present

## 2019-03-15 DIAGNOSIS — G4733 Obstructive sleep apnea (adult) (pediatric): Secondary | ICD-10-CM | POA: Diagnosis not present

## 2019-05-28 DIAGNOSIS — N61 Mastitis without abscess: Secondary | ICD-10-CM | POA: Diagnosis not present

## 2019-05-31 DIAGNOSIS — N611 Abscess of the breast and nipple: Secondary | ICD-10-CM | POA: Diagnosis not present

## 2019-08-27 DIAGNOSIS — K5730 Diverticulosis of large intestine without perforation or abscess without bleeding: Secondary | ICD-10-CM | POA: Diagnosis not present

## 2019-08-27 DIAGNOSIS — Z86010 Personal history of colonic polyps: Secondary | ICD-10-CM | POA: Diagnosis not present

## 2019-08-27 DIAGNOSIS — K635 Polyp of colon: Secondary | ICD-10-CM | POA: Diagnosis not present

## 2019-08-27 DIAGNOSIS — Z1211 Encounter for screening for malignant neoplasm of colon: Secondary | ICD-10-CM | POA: Diagnosis not present

## 2019-08-30 DIAGNOSIS — E781 Pure hyperglyceridemia: Secondary | ICD-10-CM | POA: Diagnosis not present

## 2019-08-30 DIAGNOSIS — Z23 Encounter for immunization: Secondary | ICD-10-CM | POA: Diagnosis not present

## 2019-08-30 DIAGNOSIS — J449 Chronic obstructive pulmonary disease, unspecified: Secondary | ICD-10-CM | POA: Diagnosis not present

## 2019-08-30 DIAGNOSIS — R7303 Prediabetes: Secondary | ICD-10-CM | POA: Diagnosis not present

## 2019-08-30 DIAGNOSIS — K219 Gastro-esophageal reflux disease without esophagitis: Secondary | ICD-10-CM | POA: Diagnosis not present

## 2019-08-30 DIAGNOSIS — Z1331 Encounter for screening for depression: Secondary | ICD-10-CM | POA: Diagnosis not present

## 2019-08-31 DIAGNOSIS — Z01419 Encounter for gynecological examination (general) (routine) without abnormal findings: Secondary | ICD-10-CM | POA: Diagnosis not present

## 2019-08-31 DIAGNOSIS — Z124 Encounter for screening for malignant neoplasm of cervix: Secondary | ICD-10-CM | POA: Diagnosis not present

## 2019-08-31 DIAGNOSIS — Z6835 Body mass index (BMI) 35.0-35.9, adult: Secondary | ICD-10-CM | POA: Diagnosis not present

## 2019-09-15 DIAGNOSIS — G4733 Obstructive sleep apnea (adult) (pediatric): Secondary | ICD-10-CM | POA: Diagnosis not present

## 2019-09-27 DIAGNOSIS — Z1231 Encounter for screening mammogram for malignant neoplasm of breast: Secondary | ICD-10-CM | POA: Diagnosis not present

## 2019-09-27 DIAGNOSIS — Z87891 Personal history of nicotine dependence: Secondary | ICD-10-CM | POA: Diagnosis not present

## 2020-05-24 ENCOUNTER — Ambulatory Visit
Admission: RE | Admit: 2020-05-24 | Discharge: 2020-05-24 | Disposition: A | Discharge status: Home / Self Care | Payer: PPO | Source: Ambulatory Visit | Attending: Pulmonary Disease | Admitting: Pulmonary Disease

## 2020-05-24 ENCOUNTER — Encounter: Payer: Self-pay | Admitting: Pulmonary Disease

## 2020-05-24 ENCOUNTER — Ambulatory Visit: Payer: PPO | Admitting: Pulmonary Disease

## 2020-05-24 ENCOUNTER — Other Ambulatory Visit: Payer: Self-pay

## 2020-05-24 ENCOUNTER — Ambulatory Visit
Admission: RE | Admit: 2020-05-24 | Discharge: 2020-05-24 | Disposition: A | Discharge status: Home / Self Care | Payer: PPO | Attending: Pulmonary Disease | Admitting: Pulmonary Disease

## 2020-05-24 VITALS — BP 138/80 | HR 80 | Temp 97.7°F | Ht 60.0 in | Wt 187.4 lb

## 2020-05-24 DIAGNOSIS — J449 Chronic obstructive pulmonary disease, unspecified: Secondary | ICD-10-CM | POA: Diagnosis not present

## 2020-05-24 DIAGNOSIS — G4733 Obstructive sleep apnea (adult) (pediatric): Secondary | ICD-10-CM

## 2020-05-24 DIAGNOSIS — Z9989 Dependence on other enabling machines and devices: Secondary | ICD-10-CM

## 2020-05-24 DIAGNOSIS — R0602 Shortness of breath: Secondary | ICD-10-CM | POA: Diagnosis not present

## 2020-05-24 DIAGNOSIS — E669 Obesity, unspecified: Secondary | ICD-10-CM | POA: Diagnosis not present

## 2020-05-24 MED ORDER — ANORO ELLIPTA 62.5-25 MCG/INH IN AEPB: 1.0000 | INHALATION_SPRAY | Freq: Every day | RESPIRATORY_TRACT | 0 refills | Status: AC

## 2020-05-24 MED ORDER — ANORO ELLIPTA 62.5-25 MCG/INH IN AEPB: 1.0000 | INHALATION_SPRAY | Freq: Every day | RESPIRATORY_TRACT | 3 refills | Status: DC

## 2020-05-24 NOTE — Patient Instructions (Signed)
We are going to obtain breathing tests and a chest x-ray.  We are also going to give you a trial of Anoro Ellipta 1 inhalation daily.  We will see you in follow-up in 2 months time call sooner should any new problems arise.

## 2020-05-24 NOTE — Progress Notes (Signed)
Subjective:    Patient ID: Morgan Stone, female    DOB: 1956/06/09, 64 y.o.   MRN: [id number]  HPI Patient is a 64 year old former smoker (quit 2011) who presents for evaluation of dyspnea first manifested around 2009-2010.  She is self-referred.  Her primary care provider is Lonie Peak, PA-C.  Patient states that she has been diagnosed with COPD.  We have scanned records and she states that she has had prior "lung tests" but have no record of these in what we have available of her records.  She notes that albuterol does not help her episodes of breathlessness which occur after she has "walked a distance".  She has been on Symbicort in the past but does not recall whether it helped or not.  She does note weight gain of over 40 pounds over the last year to year and a half.  She does not have any cough, sputum production, or hemoptysis.  No lower extremity edema, orthopnea or paroxysmal nocturnal dyspnea.  No calf tenderness.  She does have a history of obstructive sleep apnea which is "severe" followed by her primary provider.  She was evaluated for chest pain in 2019 by [facility] group cardiology and had negative work-up at that time.  She has not had chest pain since then.  She does have frequent gastroesophageal reflux symptoms and heartburn.  She notes that when she gets breathless the only improvement occurs when she rests and stops doing activity.  Does not endorse any other symptomatology.  Review of Systems A 10 point review of systems was performed and it is as noted above otherwise negative.  Past Medical History:  Diagnosis Date  . Atypical chest pain 08/07/2018  . COPD (chronic obstructive pulmonary disease) (HCC) 08/07/2018  . Elevated blood pressure reading 08/07/2018  . Elevated d-dimer 09/18/2018  . GERD (gastroesophageal reflux disease) 08/07/2018  . High triglycerides 08/07/2018  . Insomnia 08/07/2018  . Peripheral neuropathy 08/07/2018  . Pre-diabetes 08/07/2018  . Rheumatoid arthritis  (HCC) 08/07/2018   Past Surgical History:  Procedure Laterality Date  . APPENDECTOMY    . CHOLECYSTECTOMY    . OVARIAN CYST REMOVAL    . TONSILLECTOMY    . TUBAL LIGATION     Family History  Problem Relation Age of Onset  . Kidney disease Mother   . Congestive Heart Failure Mother   . Hypertension Brother   . Arthritis Brother   . Glaucoma Brother   . Deep vein thrombosis Sister   . Heart attack Neg Hx    Social History   Tobacco Use  . Smoking status: Former Smoker    Packs/day: 2.00    Years: 39.00    Pack years: 78.00    Types: Cigarettes    Quit date: 10/2010    Years since quitting: 9.6  . Smokeless tobacco: Never Used  Substance Use Topics  . Alcohol use: Yes    Comment: occ beer    No Known Allergies  Current Meds  Medication Sig  . aspirin EC 81 MG tablet Take 1 tablet (81 mg total) by mouth daily.  . meloxicam (MOBIC) 15 MG tablet TAKE 1 TABLET BY MOUTH EVERY DAY FOR BACK PAIN  . omeprazole (PRILOSEC) 40 MG capsule TAKE 1 CAPSULE BY MOUTH DAILY FOR GERD  . VENTOLIN HFA 108 (90 Base) MCG/ACT inhaler Inhale 2 puffs into the lungs every 4 (four) hours as needed.  . [DISCONTINUED] celecoxib (CELEBREX) 100 MG capsule Take 1 capsule (100 mg total) by mouth 2 (  two) times daily.   Immunization History  Administered Date(s) Administered  . Influenza-Unspecified 07/05/2018  . PFIZER SARS-COV-2 Vaccination 01/19/2020, 02/14/2020      Objective:   Physical Exam BP 138/80 (BP Location: Left Arm, Cuff Size: Normal)   Pulse 80   Temp 97.7 F (36.5 C) (Temporal)   Ht 5' (1.524 m)   Wt 187 lb 6.4 oz (85 kg)   SpO2 96%   BMI 36.60 kg/m   GENERAL: Obese woman, no acute distress.  Awake alert and oriented.  Fully ambulatory. HEAD: Normocephalic, atraumatic.  EYES: Pupils equal, round, reactive to light.  No scleral icterus.  MOUTH: Nose/mouth/throat not examined due to masking requirements for COVID 19. NECK: Supple. No thyromegaly. Trachea midline. No JVD.  No  adenopathy. PULMONARY: Coarse otherwise, lungs clear to auscultation bilaterally.  Good air entry symmetrically. CARDIOVASCULAR: S1 and S2. Regular rate and rhythm.  GASTROINTESTINAL: Obese abdomen otherwise unremarkable. MUSCULOSKELETAL: No joint deformity, no clubbing, no edema.  NEUROLOGIC: Awake, alert, no focal deficits noted.  No gait disturbance noted.  H is fluent. SKIN: Intact,warm,dry.  Limited exam, no rashes. PSYCH: Mood and behavior normal.   Chest x-ray obtained today: Independently reviewed, query minimal fibrotic changes at bases, otherwise no acute process noted:      Assessment & Plan:     ICD-10-CM   1. SOB (shortness of breath)  R06.02 Pulmonary Function Test [facility] Only    DG Chest 2 View   Will obtain PFTs Chest x-ray today as above   2. COPD suggested by initial evaluation (HCC)  J44.9    PFTs as above Trial of Anoro Ellipta 1 inhalation daily  3. OSA on CPAP  G47.33    Z99.89    "Severe" per patient Primary care has been following CPAP We will try to obtain records on CPAP  4. Obesity, Class II, BMI 35-39.9, isolated (see actual BMI)  E66.9    This issue adds complexity to her management May be aggravating her sensation of dyspnea   Orders Placed This Encounter  Procedures  . DG Chest 2 View    Standing Status:   Future    Number of Occurrences:   1    Standing Expiration Date:   11/24/2020    Order Specific Question:   Reason for Exam (SYMPTOM  OR DIAGNOSIS REQUIRED)    Answer:   SOB    Order Specific Question:   Preferred imaging location?    Answer:   [facility]  . Pulmonary Function Test [facility] Only    Standing Status:   Future    Number of Occurrences:   1    Standing Expiration Date:   05/24/2021    Order Specific Question:   Full PFT: includes the following: basic spirometry, spirometry pre & post bronchodilator, diffusion capacity (DLCO), lung volumes    Answer:   Full PFT   Meds ordered this encounter  Medications  .  umeclidinium-vilanterol (ANORO ELLIPTA) 62.5-25 MCG/INH AEPB    Sig: Inhale 1 puff into the lungs daily for 1 day.    Dispense:  14 each    Refill:  0    Order Specific Question:   Lot Number?    Answer:   [id number]    Order Specific Question:   Expiration Date?    Answer:   07/05/2021    Order Specific Question:   Manufacturer?    Answer:   [employer] [[id number]]    Order Specific Question:   Quantity  Answer:   2   Discussion:  Patient has a history of COPD however, pulmonary function testing does not appear to have been done previously.  She does not seem to have significant improvement with bronchodilators at least with SABA and LABA/ICS.  First order of business would be to order PFTs.  We will give her a trial of LABA/LAMA to see if this gives her any relief.  Other confounding factors with regards to dyspnea could be obesity (has had significant weight gain over the last several years) and deconditioning.  We will try to review prior cardiac studies.  We will see the patient in follow-up in 2 months time she is to contact us prior to that time should any new difficulties arise.  Gailen Shelter, MD [facility]   *This note was dictated using voice recognition software/Dragon.  Despite best efforts to proofread, errors can occur which can change the meaning.  Any change was purely unintentional.

## 2020-06-16 ENCOUNTER — Telehealth: Payer: Self-pay

## 2020-06-16 NOTE — Telephone Encounter (Signed)
Patient is aware of date/time of covid test prior to PFT.  

## 2020-06-20 ENCOUNTER — Other Ambulatory Visit: Payer: Self-pay

## 2020-06-20 ENCOUNTER — Other Ambulatory Visit
Admission: RE | Admit: 2020-06-20 | Discharge: 2020-06-20 | Disposition: A | Discharge status: Home / Self Care | Payer: PPO | Source: Ambulatory Visit | Attending: Pulmonary Disease | Admitting: Pulmonary Disease

## 2020-06-20 DIAGNOSIS — Z01812 Encounter for preprocedural laboratory examination: Secondary | ICD-10-CM | POA: Insufficient documentation

## 2020-06-20 DIAGNOSIS — Z20822 Contact with and (suspected) exposure to covid-19: Secondary | ICD-10-CM | POA: Insufficient documentation

## 2020-06-20 LAB — SARS CORONAVIRUS 2 (TAT 6-24 HRS): SARS Coronavirus 2: NEGATIVE

## 2020-06-21 ENCOUNTER — Ambulatory Visit: Payer: PPO | Attending: Pulmonary Disease

## 2020-06-21 DIAGNOSIS — Z87891 Personal history of nicotine dependence: Secondary | ICD-10-CM | POA: Insufficient documentation

## 2020-06-21 DIAGNOSIS — J449 Chronic obstructive pulmonary disease, unspecified: Secondary | ICD-10-CM | POA: Diagnosis not present

## 2020-06-21 DIAGNOSIS — R0602 Shortness of breath: Secondary | ICD-10-CM

## 2020-06-21 LAB — PULMONARY FUNCTION TEST ARMC ONLY
DL/VA % pred: 74 %
DL/VA: 3.21 ml/min/mmHg/L
DLCO unc % pred: 64 %
DLCO unc: 11.1 ml/min/mmHg
FEF 25-75 Post: 1.07 L/sec
FEF 25-75 Pre: 0.94 L/sec
FEF2575-%Change-Post: 13 %
FEF2575-%Pred-Post: 55 %
FEF2575-%Pred-Pre: 49 %
FEV1-%Change-Post: 2 %
FEV1-%Pred-Post: 83 %
FEV1-%Pred-Pre: 81 %
FEV1-Post: 1.71 L
FEV1-Pre: 1.66 L
FEV1FVC-%Change-Post: 0 %
FEV1FVC-%Pred-Pre: 89 %
FEV6-%Change-Post: 2 %
FEV6-%Pred-Post: 96 %
FEV6-%Pred-Pre: 93 %
FEV6-Post: 2.47 L
FEV6-Pre: 2.4 L
FEV6FVC-%Change-Post: 0 %
FEV6FVC-%Pred-Post: 103 %
FEV6FVC-%Pred-Pre: 104 %
FVC-%Change-Post: 3 %
FVC-%Pred-Post: 92 %
FVC-%Pred-Pre: 89 %
FVC-Post: 2.48 L
Post FEV1/FVC ratio: 69 %
Post FEV6/FVC ratio: 100 %
Pre FEV1/FVC ratio: 69 %
Pre FEV6/FVC Ratio: 100 %
RV % pred: 74 %
RV: 1.4 L
TLC % pred: 84 %
TLC: 3.76 L

## 2020-06-21 MED ORDER — ALBUTEROL SULFATE (2.5 MG/3ML) 0.083% IN NEBU
2.5000 mg | INHALATION_SOLUTION | Freq: Once | RESPIRATORY_TRACT | Status: AC
  Administered 2020-06-21: 2.5 mg via RESPIRATORY_TRACT
  Filled 2020-06-21: qty 3

## 2020-07-24 ENCOUNTER — Ambulatory Visit: Payer: PPO | Admitting: Pulmonary Disease

## 2020-07-24 ENCOUNTER — Encounter: Payer: Self-pay | Admitting: Pulmonary Disease

## 2020-07-24 ENCOUNTER — Other Ambulatory Visit: Payer: Self-pay

## 2020-07-24 VITALS — BP 142/90 | HR 100 | Temp 97.8°F | Ht 60.0 in | Wt 185.8 lb

## 2020-07-24 DIAGNOSIS — R011 Cardiac murmur, unspecified: Secondary | ICD-10-CM

## 2020-07-24 DIAGNOSIS — Z9989 Dependence on other enabling machines and devices: Secondary | ICD-10-CM | POA: Diagnosis not present

## 2020-07-24 DIAGNOSIS — J449 Chronic obstructive pulmonary disease, unspecified: Secondary | ICD-10-CM | POA: Diagnosis not present

## 2020-07-24 DIAGNOSIS — G4733 Obstructive sleep apnea (adult) (pediatric): Secondary | ICD-10-CM | POA: Diagnosis not present

## 2020-07-24 DIAGNOSIS — E669 Obesity, unspecified: Secondary | ICD-10-CM

## 2020-07-24 DIAGNOSIS — R0602 Shortness of breath: Secondary | ICD-10-CM | POA: Diagnosis not present

## 2020-07-24 MED ORDER — BREZTRI AEROSPHERE 160-9-4.8 MCG/ACT IN AERO: 2.0000 | INHALATION_SPRAY | Freq: Two times a day (BID) | RESPIRATORY_TRACT | 11 refills | Status: DC

## 2020-07-24 NOTE — Patient Instructions (Signed)
Stop using Anoro.  We are giving you a trial of Breztri 2 puffs twice a day.  Prescription was sent to your pharmacy.  We are going to get an echocardiogram (heart test)  Continue using your CPAP  We will see you in follow-up in 4 months time call sooner should any new problems arise

## 2020-07-24 NOTE — Progress Notes (Signed)
Subjective:    Patient ID: Morgan Stone, female    DOB: 08-28-1956, 64 y.o.   MRN: [id number]  HPI This is a 64 year old former smoker who presents for follow-up with regards to the issue of dyspnea.  First evaluated here on July 2021.  At that time it was noted that she had had significant weight gain.  She has obstructive sleep apnea and is on CPAP.  We do not have a download of her compliance.  This has been requested from her DME.  At her past visit we started her on a trial of Anoro Ellipta which is covered by her insurance.  She notes that this medication "gags her" does not notice significant improvement.  Had pulmonary function testing performed on 21 June 2020 which shows a combined restrictive and restrictive physiology.  Obstructive physiology likely underestimated due to restrictive component.  Restrictive component appears to be related to obesity with an ERV of 26%.  Diffusion capacity was mildly impaired.  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  The findings were discussed with the patient.  The patient has shortness of breath that is out of proportion to the noted findings on PFT.  I have discussed that improvements will occur if she loses weight.  She has not been able to use adequate inhaler therapy due to inability to use the Anoro well.  She does not well with powdered inhalers.  We reviewed the available formulary inhalers for her.  The patient has not had cardiac evaluation over the last several years.  She does have issues with hypertension and was noted to be somewhat hypertensive today.  She states that she had to "run" to the office she was late and so has not taken her medications this morning.  Has not had any chest pain, orthopnea or paroxysmal nocturnal dyspnea.  She sleeps well with her CPAP.   Review of Systems A 10 point review of systems was performed and it is as noted above otherwise negative.  No Known  Allergies  Current Meds  Medication Sig  . aspirin EC 81 MG tablet Take 1 tablet (81 mg total) by mouth daily.  . meloxicam (MOBIC) 15 MG tablet TAKE 1 TABLET BY MOUTH EVERY DAY FOR BACK PAIN  . omeprazole (PRILOSEC) 40 MG capsule TAKE 1 CAPSULE BY MOUTH DAILY FOR GERD  . VENTOLIN HFA 108 (90 Base) MCG/ACT inhaler Inhale 2 puffs into the lungs every 4 (four) hours as needed.  . [DISCONTINUED] umeclidinium-vilanterol (ANORO ELLIPTA) 62.5-25 MCG/INH AEPB Inhale 1 puff into the lungs daily.   Immunization History  Administered Date(s) Administered  . Influenza-Unspecified 07/05/2018  . PFIZER SARS-COV-2 Vaccination 01/19/2020, 02/14/2020   Social History   Tobacco Use  . Smoking status: Former Smoker    Packs/day: 2.00    Years: 39.00    Pack years: 78.00    Types: Cigarettes    Quit date: 10/2010    Years since quitting: 9.8  . Smokeless tobacco: Never Used  Substance Use Topics  . Alcohol use: Yes    Comment: occ beer       Objective:   Physical Exam BP (!) 142/90 (BP Location: Left Arm, Cuff Size: Normal)   Pulse 100   Temp 97.8 F (36.6 C) (Temporal)   Ht 5' (1.524 m)   Wt 185 lb 12.8 oz (84.3 kg)   SpO2 95%   BMI 36.29 kg/m  GENERAL: Obese woman, fully ambulatory, no acute distress. HEAD:  Normocephalic, atraumatic.  EYES: Pupils equal, round, reactive to light.  No scleral icterus.  MOUTH: Nose/mouth/throat not examined due to masking requirements for COVID 19. NECK: Supple. No thyromegaly. Trachea midline. No JVD.  No adenopathy. PULMONARY: Good air entry bilaterally.  No adventitious sounds. CARDIOVASCULAR: S1 and S2. Regular rate and rhythm.  There is a grade 2/6 to 3/6 holosystolic murmur best noted on the left sternal border.  Query radiation.  No change from prior.  No gallop or rubs noted. ABDOMEN: Obese, otherwise benign. MUSCULOSKELETAL: No joint deformity, no clubbing, no edema.  NEUROLOGIC: No overt focal deficit.  No gait disturbance.  Speech is  fluent. SKIN: Intact,warm,dry.  Limited exam, no rashes. PSYCH: Mood and behavior normal.      Assessment & Plan:     ICD-10-CM   1. SOB (shortness of breath)  R06.02 ECHOCARDIOGRAM COMPLETE   Shortness of breath is multifactorial Element of COPD and obesity Cannot exclude diastolic dysfunction 2D echo to evaluate  2. COPD, mild to moderate (HCC)  J44.9    Cannot tolerate Anoro We will switch to Breztri 2 puffs twice a day Prescription sent to her pharmacy  3. Cardiac murmur  R01.1 ECHOCARDIOGRAM COMPLETE   2D echo as noted above Help determine nature of the murmur  4. OSA on CPAP  G47.33    Z99.89    We will request compliance download from [employer] Patient instructed to continue using CPAP  5. Obesity, Class II, BMI 35-39.9, isolated  E66.9    Weight loss recommended Does have restrictive physiology due to obesity This issue adds complexity to her management    Orders Placed This Encounter  Procedures  . ECHOCARDIOGRAM COMPLETE    Standing Status:   Future    Standing Expiration Date:   01/21/2021    Scheduling Instructions:     Next available.    Order Specific Question:   Where should this test be performed    Answer:   [facility]    Order Specific Question:   Please indicate who you request to read the echo results.    Answer:   [facility]    Order Specific Question:   Perflutren DEFINITY (image enhancing agent) should be administered unless hypersensitivity or allergy exist    Answer:   Administer Perflutren    Order Specific Question:   Reason for exam-Echo    Answer:   Dyspnea  786.09 / R06.00    Order Specific Question:   Reason for exam-Echo    Answer:   Murmur  785.2 / R01.1   Meds ordered this encounter  Medications  . Budeson-Glycopyrrol-Formoterol (BREZTRI AEROSPHERE) 160-9-4.8 MCG/ACT AERO    Sig: Inhale 2 puffs into the lungs in the morning and at bedtime.    Dispense:  10.7 g    Refill:  11    Discontinue Anoro    Discussion:  Patient's dyspnea is multifactorial.  Suspect that she has an element of COPD but obesity plays a major part as noted on the PFTs.  Patient is not tolerating Anoro therefore we will give her a trial of Breztri 2 puffs twice a day which is covered by her insurance.  This should help with the COPD component.  She needs to lose weight and this has been highly recommended.  Patient is also on CPAP for obstructive sleep apnea will try to obtain a download of her compliance report.  Echocardiogram will be ordered to rule out potential diastolic dysfunction.  Patient will be seen  in follow-up in 4 months time she is to contact us prior to that time should any new difficulties arise.   Gailen Shelter, MD [facility]   *This note was dictated using voice recognition software/Dragon.  Despite best efforts to proofread, errors can occur which can change the meaning.  Any change was purely unintentional.

## 2020-07-25 ENCOUNTER — Ambulatory Visit (INDEPENDENT_AMBULATORY_CARE_PROVIDER_SITE_OTHER): Payer: PPO

## 2020-07-25 DIAGNOSIS — R0602 Shortness of breath: Secondary | ICD-10-CM | POA: Diagnosis not present

## 2020-07-25 DIAGNOSIS — R011 Cardiac murmur, unspecified: Secondary | ICD-10-CM

## 2020-07-25 LAB — ECHOCARDIOGRAM COMPLETE
Area-P 1/2: 3.03 cm2
S' Lateral: 2.8 cm

## 2020-08-15 DIAGNOSIS — J449 Chronic obstructive pulmonary disease, unspecified: Secondary | ICD-10-CM | POA: Diagnosis not present

## 2020-08-15 DIAGNOSIS — G4486 Cervicogenic headache: Secondary | ICD-10-CM | POA: Diagnosis not present

## 2020-08-15 DIAGNOSIS — H6982 Other specified disorders of Eustachian tube, left ear: Secondary | ICD-10-CM | POA: Diagnosis not present

## 2020-08-15 DIAGNOSIS — Z23 Encounter for immunization: Secondary | ICD-10-CM | POA: Diagnosis not present

## 2020-08-15 DIAGNOSIS — K219 Gastro-esophageal reflux disease without esophagitis: Secondary | ICD-10-CM | POA: Diagnosis not present

## 2020-08-15 DIAGNOSIS — M545 Low back pain, unspecified: Secondary | ICD-10-CM | POA: Diagnosis not present

## 2020-08-15 DIAGNOSIS — J309 Allergic rhinitis, unspecified: Secondary | ICD-10-CM | POA: Diagnosis not present

## 2020-08-15 DIAGNOSIS — Z6834 Body mass index (BMI) 34.0-34.9, adult: Secondary | ICD-10-CM | POA: Diagnosis not present

## 2020-08-28 ENCOUNTER — Encounter: Payer: Self-pay | Admitting: Pulmonary Disease

## 2020-08-29 ENCOUNTER — Other Ambulatory Visit: Payer: Self-pay

## 2020-08-29 ENCOUNTER — Telehealth: Payer: Self-pay

## 2020-08-29 MED ORDER — BREZTRI AEROSPHERE 160-9-4.8 MCG/ACT IN AERO: 2.0000 | INHALATION_SPRAY | Freq: Two times a day (BID) | RESPIRATORY_TRACT | 3 refills | Status: DC

## 2020-08-29 NOTE — Telephone Encounter (Signed)
90 day supply of Morgan Stone has been sent to [facility]pack by [facility] pharmacy per pharmacy request. Verified with patient that she was in fact switching pharmacies.

## 2020-08-29 NOTE — Telephone Encounter (Signed)
Spoke to patient, who is requesting for Dr. Irish Lack to review her cpap compliance report. She would like to be sure that her current settings are working for her.  Download has been placed in Dr. Jayme Cloud folder for review.  Dr. Jayme Cloud, please advise. Thanks

## 2020-08-30 NOTE — Telephone Encounter (Signed)
Patient is aware of results and voiced her understanding.  Nothing further needed.   

## 2020-08-30 NOTE — Telephone Encounter (Signed)
Note placed on compliance report. Her compliance is good. She has good control of her OSA. She has variable pressure needs so I recommend that she stays on the AutoSet.

## 2020-09-11 ENCOUNTER — Ambulatory Visit: Payer: PPO | Admitting: Cardiology

## 2020-09-15 DIAGNOSIS — R69 Illness, unspecified: Secondary | ICD-10-CM | POA: Diagnosis not present

## 2020-09-15 DIAGNOSIS — G4733 Obstructive sleep apnea (adult) (pediatric): Secondary | ICD-10-CM | POA: Diagnosis not present

## 2020-10-18 NOTE — Progress Notes (Addendum)
Cardiology Office Note:    Date:  10/19/2020   ID:  Morgan Stone, DOB [birth year], MRN [id number]  PCP:  Lonie Peak, PA-C  Cardiologist:  Norman Herrlich, MD    Referring MD: Lonie Peak, PA-C   10/20/2020: I independently reviewed her echocardiogram this morning there is at times echo dropout calculated EF is 54% and I would define from my interpretation his LV systolic function is low normal and there is RV dysfunction present which is mild. I think this is likely in the context of her sleep apnea. When asked my office to call the patient hold on taking Entresto and we will switch to a myocardial perfusion study so we can define her ejection fraction. In summary I think this is isolated RV dysfunction. ASSESSMENT:    1. Cardiomyopathy, unspecified type (HCC)   2. Chest pain of uncertain etiology   3. Shortness of breath   4. Obstructive sleep apnea   5. Chronic obstructive pulmonary disease, unspecified COPD type (HCC)    PLAN:    In order of problems listed above:  1. She has evidence of cardiomyopathy mild no clear-cut etiology.  Should be screened for CAD with a repeat cardiac CTA I stressed her the importance of CPAP as it can develop right ventricular dysfunction untreated she is for follow-up sleep study check proBNP level is baseline initiate treatment for mid range reduction of ejection fraction with Entresto. 2. Continue CPAP managed by pulmonary 3. Continue bronchodilators she has had a good response   Next appointment: 4 weeks   Medication Adjustments/Labs and Tests Ordered: Current medicines are reviewed at length with the patient today.  Concerns regarding medicines are outlined above.  Orders Placed This Encounter  Procedures  . CT CORONARY MORPH W/CTA COR W/SCORE W/CA W/CM &/OR WO/CM  . CT CORONARY FRACTIONAL FLOW RESERVE DATA PREP  . CT CORONARY FRACTIONAL FLOW RESERVE FLUID ANALYSIS  . Pro b natriuretic peptide (BNP)  . Basic metabolic panel  . EKG 12-Lead    Meds ordered this encounter  Medications  . sacubitril-valsartan (ENTRESTO) 24-26 MG    Sig: Take 1 tablet by mouth 2 (two) times daily.    Dispense:  180 tablet    Refill:  3  . metoprolol tartrate (LOPRESSOR) 100 MG tablet    Sig: Take 1 tablet (100 mg total) by mouth once for 1 dose. Take two hours prior to cardiac CT    Dispense:  1 tablet    Refill:  0    No chief complaint on file.   History of Present Illness:    Morgan Stone is a 64 y.o. female with a hx of COPD hyperlipidemia and chest pain.  She had a coronary artery calcium score performed that was 0.72 50th percentile for age and sex and CTA which showed minimal calcium score in  2019 showed no evidence of CAD.  She was last seen 10/08/2020 with continued chest pain felt to be musculoskeletal chronic costochondral pain syndrome treated with nonsteroidal anti-inflammatory drug..  Compliance with diet, lifestyle and medications: Yes  I was unaware until I was in the midst of the interaction that she has been directed here by pulmonary after an echocardiogram showed mild left ventricular and right ventricular dysfunction EF estimated 45 to 50%.  She is improved she has lost weight she is not having shortness of breath edema orthopnea palpitation or syncope.  She had an evaluation for CAD done 2 years ago.  She continues to have chest pain is nonanginal  sharp localized to her lower sternum and unfortunately she was moving furniture and is having pain today and is quite tender on physical examination.  She takes prescription meloxicam perhaps once every 2 weeks with relief.  I reviewed with her she has evidence of cardiomyopathy and I think we need to repeat her cardiac CTA.  She has a mid range ejection fraction start her on low-dose Entresto with a plan for repeat echocardiogram in 3 months hold on beta-blockers with her lung disease think she requires a loop diuretic MRA or SGLT2 inhibitor.  I asked her to check home blood  pressures and record.  Should be seen back in the office after her CTA is performed Past Medical History:  Diagnosis Date  . Atypical chest pain 08/07/2018  . Chest pain in adult 08/07/2018  . COPD (chronic obstructive pulmonary disease) (HCC) 08/07/2018  . Costochondritis 10/08/2018  . Elevated blood pressure reading 08/07/2018  . Elevated d-dimer 09/18/2018  . GERD (gastroesophageal reflux disease) 08/07/2018  . High triglycerides 08/07/2018  . Insomnia 08/07/2018  . Peripheral neuropathy 08/07/2018  . Pre-diabetes 08/07/2018  . Rheumatoid arthritis (HCC) 08/07/2018  . Shortness of breath 09/08/2018    Past Surgical History:  Procedure Laterality Date  . APPENDECTOMY    . CHOLECYSTECTOMY    . OVARIAN CYST REMOVAL    . TONSILLECTOMY    . TUBAL LIGATION      Current Medications: Current Meds  Medication Sig  . Budeson-Glycopyrrol-Formoterol (BREZTRI AEROSPHERE) 160-9-4.8 MCG/ACT AERO Inhale 2 puffs into the lungs in the morning and at bedtime.  . meloxicam (MOBIC) 15 MG tablet TAKE 1 TABLET BY MOUTH EVERY DAY FOR BACK PAIN  . omeprazole (PRILOSEC) 40 MG capsule TAKE 1 CAPSULE BY MOUTH DAILY FOR GERD     Allergies:   Patient has no known allergies.   Social History   Socioeconomic History  . Marital status: Divorced    Spouse name: Not on file  . Number of children: Not on file  . Years of education: Not on file  . Highest education level: Not on file  Occupational History  . Not on file  Tobacco Use  . Smoking status: Former Smoker    Packs/day: 2.00    Years: 39.00    Pack years: 78.00    Types: Cigarettes    Quit date: 10/2010    Years since quitting: 10.0  . Smokeless tobacco: Never Used  Vaping Use  . Vaping Use: Never used  Substance and Sexual Activity  . Alcohol use: Yes    Comment: occ beer  . Drug use: Not Currently  . Sexual activity: Not on file  Other Topics Concern  . Not on file  Social History Narrative  . Not on file   Social Determinants of  Health   Financial Resource Strain: Not on file  Food Insecurity: Not on file  Transportation Needs: Not on file  Physical Activity: Not on file  Stress: Not on file  Social Connections: Not on file     Family History: The patient's family history includes Arthritis in her brother; Congestive Heart Failure in her mother; Deep vein thrombosis in her sister; Glaucoma in her brother; Hypertension in her brother; Kidney disease in her mother. There is no history of Heart attack. ROS:   Please see the history of present illness.    All other systems reviewed and are negative.  EKGs/Labs/Other Studies Reviewed:    The following studies were reviewed today:  EKG:  EKG ordered  today and personally reviewed.  The ekg ordered today demonstrates sinus rhythm and is normal  Recent Labs: 02/28/2020 cholesterol 230 LDL 133 triglycerides 254 HDL 52 A1c 6.0%  Physical Exam:    VS:  BP 126/68 (BP Location: Right Arm, Patient Position: Sitting, Cuff Size: Normal)   Pulse 89   Ht 5' (1.524 m)   Wt 169 lb 3.2 oz (76.7 kg)   BMI 33.04 kg/m     Wt Readings from Last 3 Encounters:  10/19/20 169 lb 3.2 oz (76.7 kg)  07/24/20 185 lb 12.8 oz (84.3 kg)  05/24/20 187 lb 6.4 oz (85 kg)     GEN:  Well nourished, well developed in no acute distress HEENT: Normal NECK: No JVD; No carotid bruits LYMPHATICS: No lymphadenopathy CARDIAC: RRR, no murmurs, rubs, gallops RESPIRATORY:  Clear to auscultation without rales, wheezing or rhonchi  ABDOMEN: Soft, non-tender, non-distended MUSCULOSKELETAL:  No edema; No deformity  SKIN: Warm and dry NEUROLOGIC:  Alert and oriented x 3 PSYCHIATRIC:  Normal affect    Signed, Norman Herrlich, MD  10/19/2020 4:51 PM    [facility]

## 2020-10-19 ENCOUNTER — Other Ambulatory Visit: Payer: Self-pay

## 2020-10-19 ENCOUNTER — Ambulatory Visit: Payer: PPO | Admitting: Cardiology

## 2020-10-19 VITALS — BP 126/68 | HR 89 | Ht 60.0 in | Wt 169.2 lb

## 2020-10-19 DIAGNOSIS — I429 Cardiomyopathy, unspecified: Secondary | ICD-10-CM

## 2020-10-19 DIAGNOSIS — J449 Chronic obstructive pulmonary disease, unspecified: Secondary | ICD-10-CM

## 2020-10-19 DIAGNOSIS — G4733 Obstructive sleep apnea (adult) (pediatric): Secondary | ICD-10-CM | POA: Diagnosis not present

## 2020-10-19 DIAGNOSIS — R0602 Shortness of breath: Secondary | ICD-10-CM

## 2020-10-19 DIAGNOSIS — R079 Chest pain, unspecified: Secondary | ICD-10-CM

## 2020-10-19 DIAGNOSIS — I517 Cardiomegaly: Secondary | ICD-10-CM | POA: Diagnosis not present

## 2020-10-19 MED ORDER — SACUBITRIL-VALSARTAN 24-26 MG PO TABS: 1.0000 | ORAL_TABLET | Freq: Two times a day (BID) | ORAL | 3 refills | Status: DC

## 2020-10-19 MED ORDER — METOPROLOL TARTRATE 100 MG PO TABS: 100.0000 mg | ORAL_TABLET | Freq: Once | ORAL | 0 refills | Status: DC

## 2020-10-19 NOTE — Patient Instructions (Signed)
Medication Instructions:  Your physician has recommended you make the following change in your medication:  START: Entresto 24/26 mg take one tablet by mouth twice daily.  *If you need a refill on your cardiac medications before your next appointment, please call your pharmacy*   Lab Work: Your physician recommends that you return for lab work in: TODAY BMP, ProBNP If you have labs (blood work) drawn today and your tests are completely normal, you will receive your results only by: Marland Kitchen MyChart Message (if you have MyChart) OR . A paper copy in the mail If you have any lab test that is abnormal or we need to change your treatment, we will call you to review the results.   Testing/Procedures: Your cardiac CT will be scheduled at the below location:   [facility] [address] [place], [place] [address] [phone]  If scheduled at [facility], please arrive at the [facility] main entrance of [facility] 30 minutes prior to test start time. Proceed to the [facility] Radiology Department (first floor) to check-in and test prep.  Please follow these instructions carefully (unless otherwise directed):   On the Night Before the Test: . Be sure to Drink plenty of water. . Do not consume any caffeinated/decaffeinated beverages or chocolate 12 hours prior to your test. . Do not take any antihistamines 12 hours prior to your test.  On the Day of the Test: . Drink plenty of water. Do not drink any water within one hour of the test. . Do not eat any food 4 hours prior to the test. . You may take your regular medications prior to the test.  . Take metoprolol (Lopressor) two hours prior to test. . FEMALES- please wear underwire-free bra if available      After the Test: . Drink plenty of water. . After receiving IV contrast, you may experience a mild flushed feeling. This is normal. . On occasion, you may experience a mild rash up to 24 hours after the  test. This is not dangerous. If this occurs, you can take Benadryl 25 mg and increase your fluid intake. . If you experience trouble breathing, this can be serious. If it is severe call 911 IMMEDIATELY. If it is mild, please call our office. . If you take any of these medications: Glipizide/Metformin, Avandament, Glucavance, please do not take 48 hours after completing test unless otherwise instructed.   Once we have confirmed authorization from your insurance company, we will call you to set up a date and time for your test. Based on how quickly your insurance processes prior authorizations requests, please allow up to 4 weeks to be contacted for scheduling your Cardiac CT appointment. Be advised that routine Cardiac CT appointments could be scheduled as many as 8 weeks after your provider has ordered it.  For non-scheduling related questions, please contact the cardiac imaging nurse navigator should you have any questions/concerns: Marchia Bond, Cardiac Imaging Nurse Navigator Burley Saver, Interim Cardiac Imaging Nurse [place] [facility] Direct Office Dial: [phone]   For scheduling needs, including cancellations and rescheduling, please call [place], [phone].     Follow-Up: At [facility], you and your health needs are our priority.  As part of our continuing mission to provide you with exceptional heart care, we have created designated Provider Care Teams.  These Care Teams include your primary Cardiologist (physician) and Advanced Practice Providers (APPs -  Physician Assistants and Nurse Practitioners) who all  work together to provide you with the care you need, when you need it.  We recommend signing up for the patient portal called "MyChart".  Sign up information is provided on this After Visit Summary.  MyChart is used to connect with patients for Virtual Visits (Telemedicine).  Patients are able to view lab/test results, encounter notes,  upcoming appointments, etc.  Non-urgent messages can be sent to your provider as well.   To learn more about what you can do with MyChart, go to [email or web address].    Your next appointment:   4 week(s)  The format for your next appointment:   In Person  Provider:   Shirlee More, MD   Other Instructions

## 2020-10-20 ENCOUNTER — Telehealth: Payer: Self-pay | Admitting: Physician Assistant

## 2020-10-20 ENCOUNTER — Telehealth: Payer: Self-pay

## 2020-10-20 DIAGNOSIS — R079 Chest pain, unspecified: Secondary | ICD-10-CM

## 2020-10-20 LAB — BASIC METABOLIC PANEL
BUN/Creatinine Ratio: 29 — ABNORMAL HIGH (ref 12–28)
BUN: 23 mg/dL (ref 8–27)
CO2: 22 mmol/L (ref 20–29)
Calcium: 9.5 mg/dL (ref 8.7–10.3)
Chloride: 104 mmol/L (ref 96–106)
Creatinine, Ser: 0.79 mg/dL (ref 0.57–1.00)
GFR calc Af Amer: 91 mL/min/{1.73_m2} (ref >59)
GFR calc non Af Amer: 79 mL/min/{1.73_m2} (ref >59)
Glucose: 84 mg/dL (ref 65–99)
Potassium: 4 mmol/L (ref 3.5–5.2)
Sodium: 142 mmol/L (ref 134–144)

## 2020-10-20 LAB — PRO B NATRIURETIC PEPTIDE: NT-Pro BNP: 85 pg/mL (ref 0–301)

## 2020-10-20 NOTE — Telephone Encounter (Signed)
Re: Morgan Stone March 04, 1956, please tell her I looked at her echocardiogram myself I think what is being seen here is due to her sleep apnea lets not do a cardiac CTA and let us ask her to hold Entresto. I hope she has not picked up the prescription. What I would like her to do is get a myocardial perfusion study in the [place] office and I think that would be the most helpful because it gives a quantitative number. Her echo showed a calculated EF of 54% and that is within the range of low normal for the left ventricle and the abnormality presence of the right ventricle can be seen with sleep apnea but not well treated.     [facility] [address] [name], [place] [address] Phone:  [phone]    Please arrive 15 minutes prior to your appointment time for registration and insurance purposes.  The test will take approximately 3 to 4 hours to complete; you may bring reading material.  If someone comes with you to your appointment, they will need to remain in the main lobby due to limited space in the testing area. **If you are pregnant or breastfeeding, please notify the nuclear lab prior to your appointment**  How to prepare for your Myocardial Perfusion Test: . Do not eat or drink 3 hours prior to your test, except you may have water. . Do not consume products containing caffeine (regular or decaffeinated) 12 hours prior to your test. (ex: coffee, chocolate, sodas, tea). . Do bring a list of your current medications with you.  If not listed below, you may take your medications as normal. . Do wear comfortable clothes (no dresses or overalls) and walking shoes, tennis shoes preferred (No heels or open toe shoes are allowed). . Do NOT wear cologne, perfume, aftershave, or lotions (deodorant is allowed). . If these instructions are not followed, your test will have to be rescheduled.  Please report to [address] for your test.  If you have questions or  concerns about your appointment, you can call the [facility] at [phone].  If you cannot keep your appointment, please provide 24 hours notification to the Nuclear Lab, to avoid a possible $50 charge to your account.

## 2020-10-20 NOTE — Telephone Encounter (Signed)
Appt scheduled

## 2020-10-23 DIAGNOSIS — Z9989 Dependence on other enabling machines and devices: Secondary | ICD-10-CM | POA: Diagnosis not present

## 2020-10-23 DIAGNOSIS — Z6832 Body mass index (BMI) 32.0-32.9, adult: Secondary | ICD-10-CM | POA: Diagnosis not present

## 2020-10-23 DIAGNOSIS — G4733 Obstructive sleep apnea (adult) (pediatric): Secondary | ICD-10-CM | POA: Diagnosis not present

## 2020-10-25 NOTE — Addendum Note (Signed)
Addended by: Norman Herrlich on: 10/25/2020 11:40 AM   Modules accepted: Orders

## 2020-10-30 ENCOUNTER — Other Ambulatory Visit: Payer: Self-pay

## 2020-10-30 DIAGNOSIS — R079 Chest pain, unspecified: Secondary | ICD-10-CM

## 2020-11-06 ENCOUNTER — Telehealth (HOSPITAL_COMMUNITY): Payer: Self-pay | Admitting: *Deleted

## 2020-11-06 NOTE — Telephone Encounter (Signed)
Left message on voicemail per DPR in reference to upcoming appointment scheduled on 11/09/19 at 8:00 with detailed instructions given per Myocardial Perfusion Study Information Sheet for the test. LM to arrive 15 minutes early, and that it is imperative to arrive on time for appointment to keep from having the test rescheduled. If you need to cancel or reschedule your appointment, please call the office within 24 hours of your appointment. Failure to do so may result in a cancellation of your appointment, and a $50 no show fee. Phone number given for call back for any questions.

## 2020-11-08 ENCOUNTER — Other Ambulatory Visit: Payer: Self-pay

## 2020-11-08 ENCOUNTER — Ambulatory Visit (INDEPENDENT_AMBULATORY_CARE_PROVIDER_SITE_OTHER): Payer: PPO

## 2020-11-08 DIAGNOSIS — R079 Chest pain, unspecified: Secondary | ICD-10-CM

## 2020-11-08 LAB — MYOCARDIAL PERFUSION IMAGING
LV dias vol: 62 mL (ref 46–106)
LV sys vol: 26 mL
Peak HR: 104 {beats}/min
Rest HR: 67 {beats}/min
SDS: 3
SRS: 4
SSS: 7
TID: 1.02

## 2020-11-08 MED ORDER — REGADENOSON 0.4 MG/5ML IV SOLN
0.4000 mg | Freq: Once | INTRAVENOUS | Status: AC
  Administered 2020-11-08: 0.4 mg via INTRAVENOUS

## 2020-11-08 MED ORDER — TECHNETIUM TC 99M TETROFOSMIN IV KIT
9.7000 | PACK | Freq: Once | INTRAVENOUS | Status: AC | PRN
  Administered 2020-11-08: 9.7 via INTRAVENOUS

## 2020-11-08 MED ORDER — TECHNETIUM TC 99M TETROFOSMIN IV KIT
32.4000 | PACK | Freq: Once | INTRAVENOUS | Status: AC | PRN
  Administered 2020-11-08: 32.4 via INTRAVENOUS

## 2020-11-08 MED ORDER — AMINOPHYLLINE 25 MG/ML IV SOLN
75.0000 mg | Freq: Once | INTRAVENOUS | Status: AC
  Administered 2020-11-08: 75 mg via INTRAVENOUS

## 2020-11-09 ENCOUNTER — Telehealth: Payer: Self-pay

## 2020-11-09 NOTE — Telephone Encounter (Signed)
Spoke with patient regarding results and recommendation.  Patient verbalizes understanding and is agreeable to plan of care. Advised patient to call back with any issues or concerns.  

## 2020-11-09 NOTE — Telephone Encounter (Signed)
-----   Message from Baldo Daub, MD sent at 11/08/2020  4:53 PM EST ----- This is a very good result  Function heart muscle was normal  No findings to suggest blocked arteries

## 2020-11-10 ENCOUNTER — Other Ambulatory Visit: Payer: Self-pay | Admitting: Physician Assistant

## 2020-11-10 DIAGNOSIS — Z1231 Encounter for screening mammogram for malignant neoplasm of breast: Secondary | ICD-10-CM

## 2020-11-16 NOTE — Progress Notes (Deleted)
Cardiology Office Note:    Date:  11/16/2020   ID:  Morgan Stone, DOB 1956-03-26, MRN [id number]  PCP:  Lonie Peak, PA-C  Cardiologist:  Norman Herrlich, MD    Referring MD: Lonie Peak, PA-C    ASSESSMENT:    No diagnosis found. PLAN:    In order of problems listed above:  1. ***   Next appointment: ***   Medication Adjustments/Labs and Tests Ordered: Current medicines are reviewed at length with the patient today.  Concerns regarding medicines are outlined above.  No orders of the defined types were placed in this encounter.  No orders of the defined types were placed in this encounter.   No chief complaint on file.   History of Present Illness:    Morgan Stone is a 65 y.o. female with a hx of abnormal echocardiogram raising concerns for cardiomyopathy COPD and sleep apnea last seen 10/20/2019. Compliance with diet, lifestyle and medications: ***  Subsequently she had a myocardial perfusion test 11/09/2019.  Ejection fraction was normal 59% with normal wall motion perfusion was normal and no evidence of cardiac ischemia.  I personally reviewed her initial echocardiogram and felt that left ventricular function was normal dysfunction in the context of COPD and sleep apnea. Past Medical History:  Diagnosis Date  . Atypical chest pain 08/07/2018  . Chest pain in adult 08/07/2018  . COPD (chronic obstructive pulmonary disease) (HCC) 08/07/2018  . Costochondritis 10/08/2018  . Elevated blood pressure reading 08/07/2018  . Elevated d-dimer 09/18/2018  . GERD (gastroesophageal reflux disease) 08/07/2018  . High triglycerides 08/07/2018  . Insomnia 08/07/2018  . Peripheral neuropathy 08/07/2018  . Pre-diabetes 08/07/2018  . Rheumatoid arthritis (HCC) 08/07/2018  . Shortness of breath 09/08/2018    Past Surgical History:  Procedure Laterality Date  . APPENDECTOMY    . CHOLECYSTECTOMY    . OVARIAN CYST REMOVAL    . TONSILLECTOMY    . TUBAL LIGATION      Current  Medications: No outpatient medications have been marked as taking for the 11/17/20 encounter (Appointment) with Baldo Daub, MD.     Allergies:   Patient has no known allergies.   Social History   Socioeconomic History  . Marital status: Divorced    Spouse name: Not on file  . Number of children: Not on file  . Years of education: Not on file  . Highest education level: Not on file  Occupational History  . Not on file  Tobacco Use  . Smoking status: Former Smoker    Packs/day: 2.00    Years: 39.00    Pack years: 78.00    Types: Cigarettes    Quit date: 10/2010    Years since quitting: 10.1  . Smokeless tobacco: Never Used  Vaping Use  . Vaping Use: Never used  Substance and Sexual Activity  . Alcohol use: Yes    Comment: occ beer  . Drug use: Not Currently  . Sexual activity: Not on file  Other Topics Concern  . Not on file  Social History Narrative  . Not on file   Social Determinants of Health   Financial Resource Strain: Not on file  Food Insecurity: Not on file  Transportation Needs: Not on file  Physical Activity: Not on file  Stress: Not on file  Social Connections: Not on file     Family History: The patient's ***family history includes Arthritis in her brother; Congestive Heart Failure in her mother; Deep vein thrombosis in her sister; Glaucoma in her  brother; Hypertension in her brother; Kidney disease in her mother. There is no history of Heart attack. ROS:   Please see the history of present illness.    All other systems reviewed and are negative.  EKGs/Labs/Other Studies Reviewed:    The following studies were reviewed today:  EKG:  EKG ordered today and personally reviewed.  The ekg ordered today demonstrates ***  Recent Labs: 10/19/2020: BUN 23; Creatinine, Ser 0.79; NT-Pro BNP 85; Potassium 4.0; Sodium 142  Recent Lipid Panel No results found for: CHOL, TRIG, HDL, CHOLHDL, VLDL, LDLCALC, LDLDIRECT  Physical Exam:    VS:  There were  no vitals taken for this visit.    Wt Readings from Last 3 Encounters:  11/08/20 169 lb (76.7 kg)  10/19/20 169 lb 3.2 oz (76.7 kg)  07/24/20 185 lb 12.8 oz (84.3 kg)     GEN: *** Well nourished, well developed in no acute distress HEENT: Normal NECK: No JVD; No carotid bruits LYMPHATICS: No lymphadenopathy CARDIAC: ***RRR, no murmurs, rubs, gallops RESPIRATORY:  Clear to auscultation without rales, wheezing or rhonchi  ABDOMEN: Soft, non-tender, non-distended MUSCULOSKELETAL:  No edema; No deformity  SKIN: Warm and dry NEUROLOGIC:  Alert and oriented x 3 PSYCHIATRIC:  Normal affect    Signed, Norman Herrlich, MD  11/16/2020 1:20 PM    [facility]

## 2020-11-17 ENCOUNTER — Ambulatory Visit: Payer: PPO | Admitting: Cardiology

## 2020-11-21 DIAGNOSIS — G4733 Obstructive sleep apnea (adult) (pediatric): Secondary | ICD-10-CM | POA: Diagnosis not present

## 2020-11-21 DIAGNOSIS — R5383 Other fatigue: Secondary | ICD-10-CM | POA: Diagnosis not present

## 2020-11-21 DIAGNOSIS — R0600 Dyspnea, unspecified: Secondary | ICD-10-CM | POA: Diagnosis not present

## 2020-11-23 ENCOUNTER — Ambulatory Visit: Payer: PPO | Admitting: Pulmonary Disease

## 2020-11-28 DIAGNOSIS — Z1231 Encounter for screening mammogram for malignant neoplasm of breast: Secondary | ICD-10-CM | POA: Diagnosis not present

## 2020-11-30 DIAGNOSIS — H2513 Age-related nuclear cataract, bilateral: Secondary | ICD-10-CM | POA: Diagnosis not present

## 2020-11-30 DIAGNOSIS — H5203 Hypermetropia, bilateral: Secondary | ICD-10-CM | POA: Diagnosis not present

## 2020-11-30 DIAGNOSIS — H524 Presbyopia: Secondary | ICD-10-CM | POA: Diagnosis not present

## 2020-11-30 DIAGNOSIS — H52223 Regular astigmatism, bilateral: Secondary | ICD-10-CM | POA: Diagnosis not present

## 2020-12-06 ENCOUNTER — Ambulatory Visit: Payer: PPO | Admitting: Cardiology

## 2020-12-06 ENCOUNTER — Encounter: Payer: Self-pay | Admitting: Cardiology

## 2020-12-06 ENCOUNTER — Other Ambulatory Visit: Payer: Self-pay

## 2020-12-06 VITALS — BP 148/88 | HR 94 | Ht 60.0 in | Wt 169.0 lb

## 2020-12-06 DIAGNOSIS — I429 Cardiomyopathy, unspecified: Secondary | ICD-10-CM

## 2020-12-06 DIAGNOSIS — G4733 Obstructive sleep apnea (adult) (pediatric): Secondary | ICD-10-CM

## 2020-12-06 DIAGNOSIS — R030 Elevated blood-pressure reading, without diagnosis of hypertension: Secondary | ICD-10-CM

## 2020-12-06 DIAGNOSIS — J449 Chronic obstructive pulmonary disease, unspecified: Secondary | ICD-10-CM

## 2020-12-06 NOTE — Patient Instructions (Addendum)
Medication Instructions:  Your physician recommends that you continue on your current medications as directed. Please refer to the Current Medication list given to you today.  *If you need a refill on your cardiac medications before your next appointment, please call your pharmacy*   Lab Work: None If you have labs (blood work) drawn today and your tests are completely normal, you will receive your results only by: Marland Kitchen MyChart Message (if you have MyChart) OR . A paper copy in the mail If you have any lab test that is abnormal or we need to change your treatment, we will call you to review the results.   Testing/Procedures: NOne   Follow-Up: At [facility], you and your health needs are our priority.  As part of our continuing mission to provide you with exceptional heart care, we have created designated Provider Care Teams.  These Care Teams include your primary Cardiologist (physician) and Advanced Practice Providers (APPs -  Physician Assistants and Nurse Practitioners) who all work together to provide you with the care you need, when you need it.  We recommend signing up for the patient portal called "MyChart".  Sign up information is provided on this After Visit Summary.  MyChart is used to connect with patients for Virtual Visits (Telemedicine).  Patients are able to view lab/test results, encounter notes, upcoming appointments, etc.  Non-urgent messages can be sent to your provider as well.   To learn more about what you can do with MyChart, go to [email or web address].    Your next appointment:   As needed  The format for your next appointment:   In Person  Provider:   Norman Herrlich, MD   Other Instructions Please get a new blood pressure and take your blood pressure daily. If the top (systolic) stays above 140 please contact us via Mychart

## 2020-12-06 NOTE — Progress Notes (Signed)
Cardiology Office Note:    Date:  12/06/2020   ID:  Morgan Stone, DOB August 22, 1956, MRN [id number]  PCP:  Lonie Peak, PA-C  Cardiologist:  Norman Herrlich, MD    Referring MD: Lonie Peak, PA-C    ASSESSMENT:    1. Cardiomyopathy, unspecified type (HCC)   2. Obstructive sleep apnea   3. Chronic obstructive pulmonary disease, unspecified COPD type (HCC)   4. Elevated blood pressure reading without diagnosis of hypertension    PLAN:    In order of problems listed above:  1. Further testing myocardial perfusion study shows normal left ventricular function.  She has mild right ventricular dysfunction in the setting of lung disease sleep apnea being addressed by pulmonary and has a scheduled CPAP titration sleep study this week. 2. Of concern she has unrecognized hypertension I told her that the wrist cuffs are unreliable to purchase a digital arm cuff check blood pressure daily and if she gets numbers consistently greater than 140 to contact me and I will place her in an ARB.   Next appointment: 1 year   Medication Adjustments/Labs and Tests Ordered: Current medicines are reviewed at length with the patient today.  Concerns regarding medicines are outlined above.  No orders of the defined types were placed in this encounter.  No orders of the defined types were placed in this encounter.   Chief Complaint  Patient presents with  . Follow-up    After myocardial perfusion test    History of Present Illness:    Morgan Stone is a 65 y.o. female with a hx of COPD hyperlipidemia and chest pain felt to be chronic costochondral pain syndrome with minimal coronary atherosclerosis on CT in 2019.  She was last seen 10/19/2020 following an echocardiogram showing mild left and right ventricular dysfunction.  Advised her to undergo repeat cardiac CTA she chose a myocardial perfusion study performed 11/19/2019 her left ventricular ejection fraction was normal 59% she had no ischemia and normal  left ventricular systolic function with fixed defect due to attenuation.  Her proBNP level 1 month ago was quite low at 85 not consistent with heart failure.  Clinically dysfunction is related to her COPD and sleep apnea being treated with CPAP by pulmonary.  She is scheduled for follow-up sleep apnea test She has trouble tolerating CPAP she wakes up at 3 in the morning as to take her mask off. She is improved not short of breath and is no longer using her bronchodilator. 2 of the last 3 times she is seen her systolics are greater than 140 but she tells me her home blood pressures consistently 120-130/70-80 however she is using a wrist cuff and I asked her to purchase an arm digital cup and send me a message by my chart if she is getting systolics greater than 140 and I would place her on an ARB. I reviewed her myocardial perfusion test with her her ejection fraction is normal she has no evidence of left ventricular dysfunction or ischemia is not having angina edema orthopnea or significant shortness of breath  Compliance with diet, lifestyle and medications: Yes Past Medical History:  Diagnosis Date  . Atypical chest pain 08/07/2018  . Chest pain in adult 08/07/2018  . COPD (chronic obstructive pulmonary disease) (HCC) 08/07/2018  . Costochondritis 10/08/2018  . Elevated blood pressure reading 08/07/2018  . Elevated d-dimer 09/18/2018  . GERD (gastroesophageal reflux disease) 08/07/2018  . High triglycerides 08/07/2018  . Insomnia 08/07/2018  . Peripheral neuropathy 08/07/2018  .  Pre-diabetes 08/07/2018  . Rheumatoid arthritis (HCC) 08/07/2018  . Shortness of breath 09/08/2018    Past Surgical History:  Procedure Laterality Date  . APPENDECTOMY    . CHOLECYSTECTOMY    . OVARIAN CYST REMOVAL    . TONSILLECTOMY    . TUBAL LIGATION      Current Medications: Current Meds  Medication Sig  . fluticasone (FLONASE) 50 MCG/ACT nasal spray Place 2 sprays into both nostrils daily.  . meloxicam  (MOBIC) 15 MG tablet TAKE 1 TABLET BY MOUTH EVERY DAY FOR BACK PAIN  . omeprazole (PRILOSEC) 20 MG capsule Take 20 mg by mouth daily.     Allergies:   Patient has no known allergies.   Social History   Socioeconomic History  . Marital status: Divorced    Spouse name: Not on file  . Number of children: Not on file  . Years of education: Not on file  . Highest education level: Not on file  Occupational History  . Not on file  Tobacco Use  . Smoking status: Former Smoker    Packs/day: 2.00    Years: 39.00    Pack years: 78.00    Types: Cigarettes    Quit date: 10/2010    Years since quitting: 10.1  . Smokeless tobacco: Never Used  Vaping Use  . Vaping Use: Never used  Substance and Sexual Activity  . Alcohol use: Yes    Comment: occ beer  . Drug use: Not Currently  . Sexual activity: Not on file  Other Topics Concern  . Not on file  Social History Narrative  . Not on file   Social Determinants of Health   Financial Resource Strain: Not on file  Food Insecurity: Not on file  Transportation Needs: Not on file  Physical Activity: Not on file  Stress: Not on file  Social Connections: Not on file     Family History: The patient's family history includes Arthritis in her brother; Congestive Heart Failure in her mother; Deep vein thrombosis in her sister; Glaucoma in her brother; Hypertension in her brother; Kidney disease in her mother. There is no history of Heart attack. ROS:   Please see the history of present illness.    All other systems reviewed and are negative.  EKGs/Labs/Other Studies Reviewed:    The following studies were reviewed today:    Recent Labs: 10/19/2020: BUN 23; Creatinine, Ser 0.79; NT-Pro BNP 85; Potassium 4.0; Sodium 142  Recent Lipid Panel 02/28/2020: Cholesterol 230 LDL 133 triglycerides 254 HDL 52  Physical Exam:    VS:  BP (!) 148/88   Pulse 94   Ht 5' (1.524 m)   Wt 169 lb (76.7 kg)   SpO2 96%   BMI 33.01 kg/m     Wt  Readings from Last 3 Encounters:  12/06/20 169 lb (76.7 kg)  11/08/20 169 lb (76.7 kg)  10/19/20 169 lb 3.2 oz (76.7 kg)     GEN:  Well nourished, well developed in no acute distress HEENT: Normal NECK: No JVD; No carotid bruits LYMPHATICS: No lymphadenopathy CARDIAC: RRR, no murmurs, rubs, gallops RESPIRATORY:  Clear to auscultation without rales, wheezing or rhonchi  ABDOMEN: Soft, non-tender, non-distended MUSCULOSKELETAL:  No edema; No deformity  SKIN: Warm and dry NEUROLOGIC:  Alert and oriented x 3 PSYCHIATRIC:  Normal affect    Signed, Norman Herrlich, MD  12/06/2020 11:48 AM    [facility]

## 2020-12-20 DIAGNOSIS — G4733 Obstructive sleep apnea (adult) (pediatric): Secondary | ICD-10-CM | POA: Diagnosis not present

## 2020-12-25 DIAGNOSIS — G4733 Obstructive sleep apnea (adult) (pediatric): Secondary | ICD-10-CM | POA: Diagnosis not present

## 2020-12-25 DIAGNOSIS — R0600 Dyspnea, unspecified: Secondary | ICD-10-CM | POA: Diagnosis not present

## 2020-12-25 DIAGNOSIS — R5383 Other fatigue: Secondary | ICD-10-CM | POA: Diagnosis not present

## 2020-12-26 DIAGNOSIS — G4733 Obstructive sleep apnea (adult) (pediatric): Secondary | ICD-10-CM | POA: Diagnosis not present

## 2020-12-26 DIAGNOSIS — R0600 Dyspnea, unspecified: Secondary | ICD-10-CM | POA: Diagnosis not present

## 2020-12-28 ENCOUNTER — Telehealth: Payer: Self-pay | Admitting: Pulmonary Disease

## 2020-12-28 NOTE — Telephone Encounter (Signed)
Received record request. Echo has been faxed to [facility].  Received successful fax confirmation.  Nothing further needed.

## 2020-12-28 NOTE — Telephone Encounter (Signed)
Lm for Morgan Stone with [facility].

## 2020-12-28 NOTE — Telephone Encounter (Signed)
Spoke to [employer] with [facility]. Trinna Post is requesting echo results.  I have requested that [facility] fax over medical request form.

## 2021-01-01 DIAGNOSIS — G4733 Obstructive sleep apnea (adult) (pediatric): Secondary | ICD-10-CM | POA: Diagnosis not present

## 2021-01-01 IMAGING — CR DG CHEST 2V
1 series · 2 of 2 positions shown · non-contrast
Comparison: 09/21/2018 chest radiograph.

CLINICAL DATA: Shortness of breath.

EXAM:
CHEST - 2 VIEW

[Series 1: dg chest 2 view · 0.14mm/px · 2 of 2 slices shown]
[im 1/2]
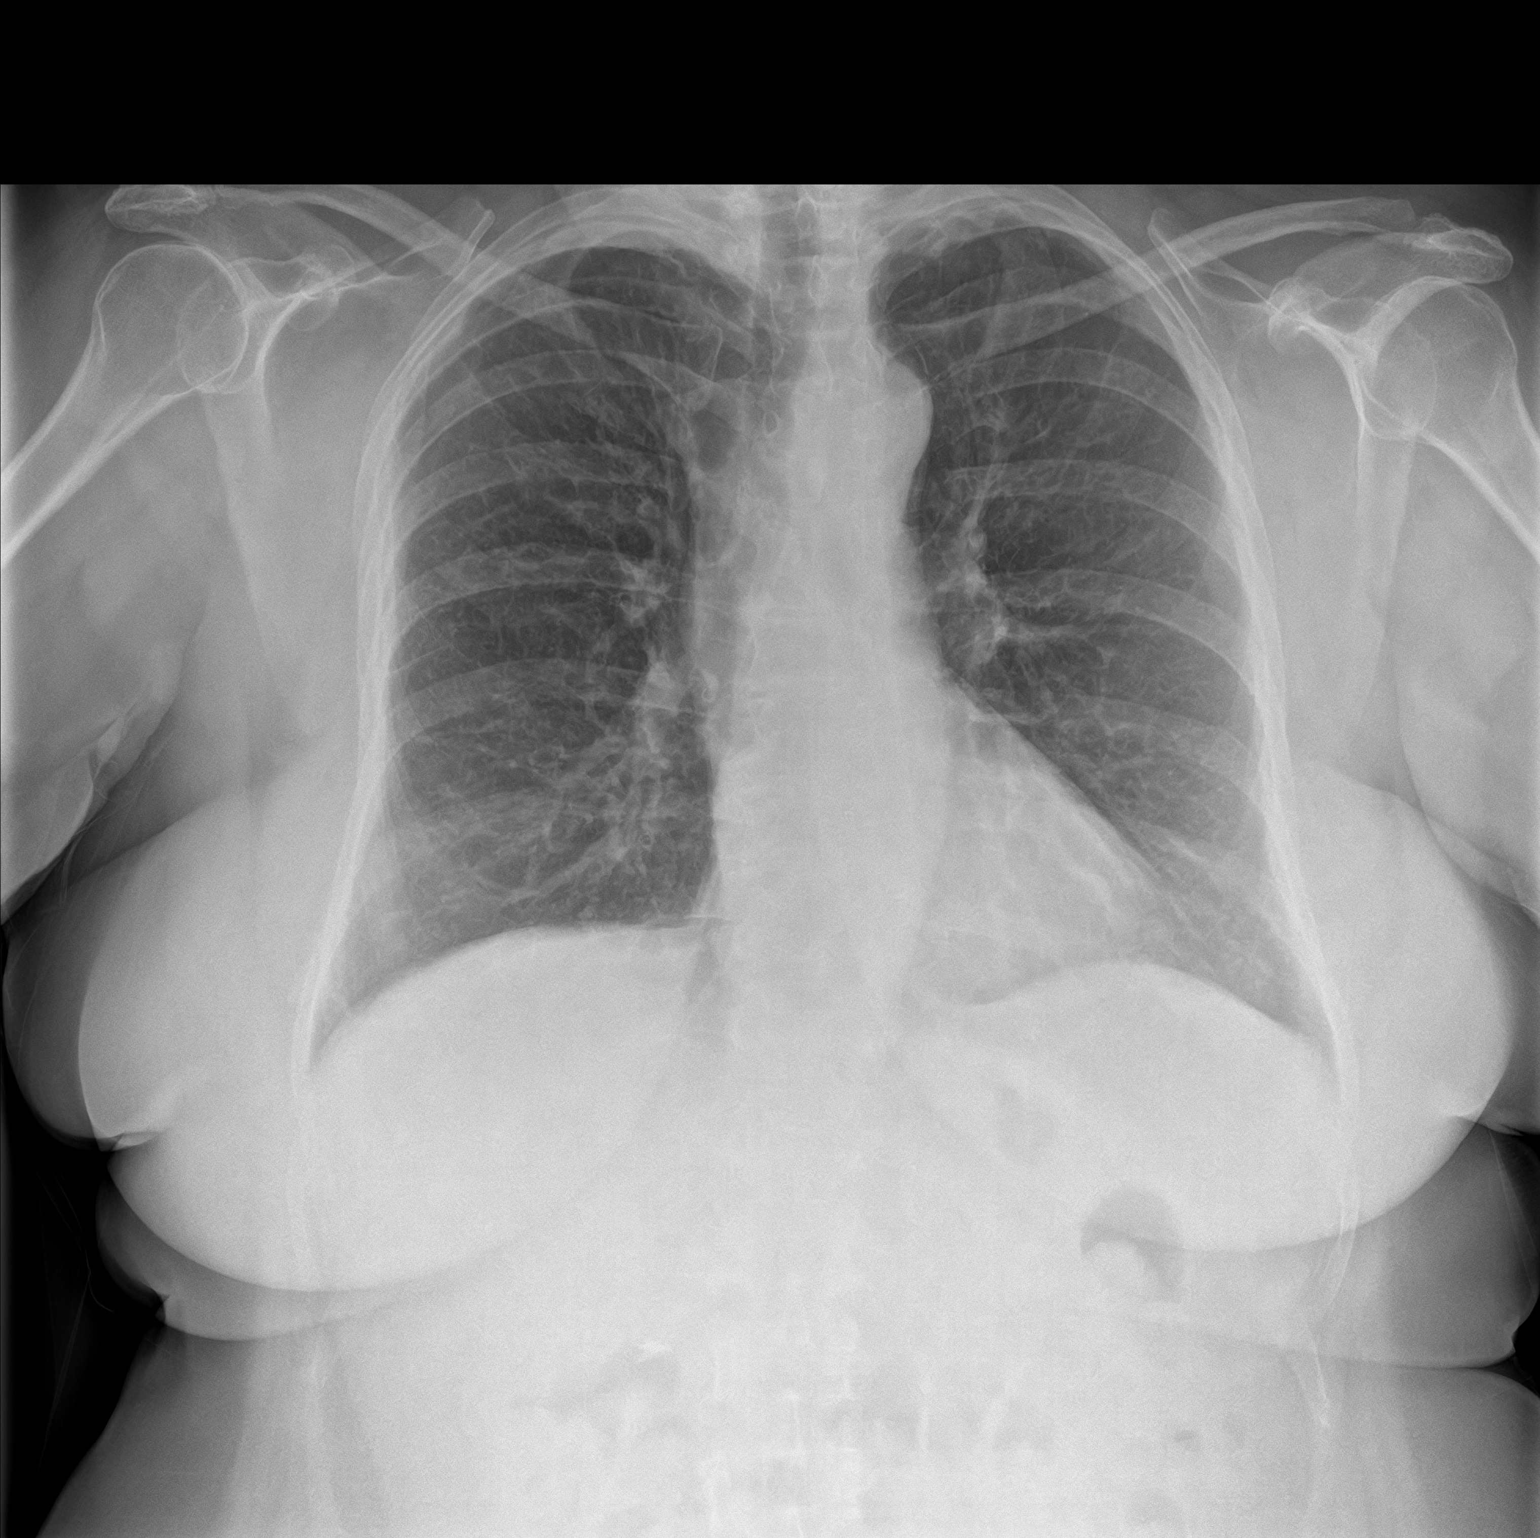
[im 2/2]
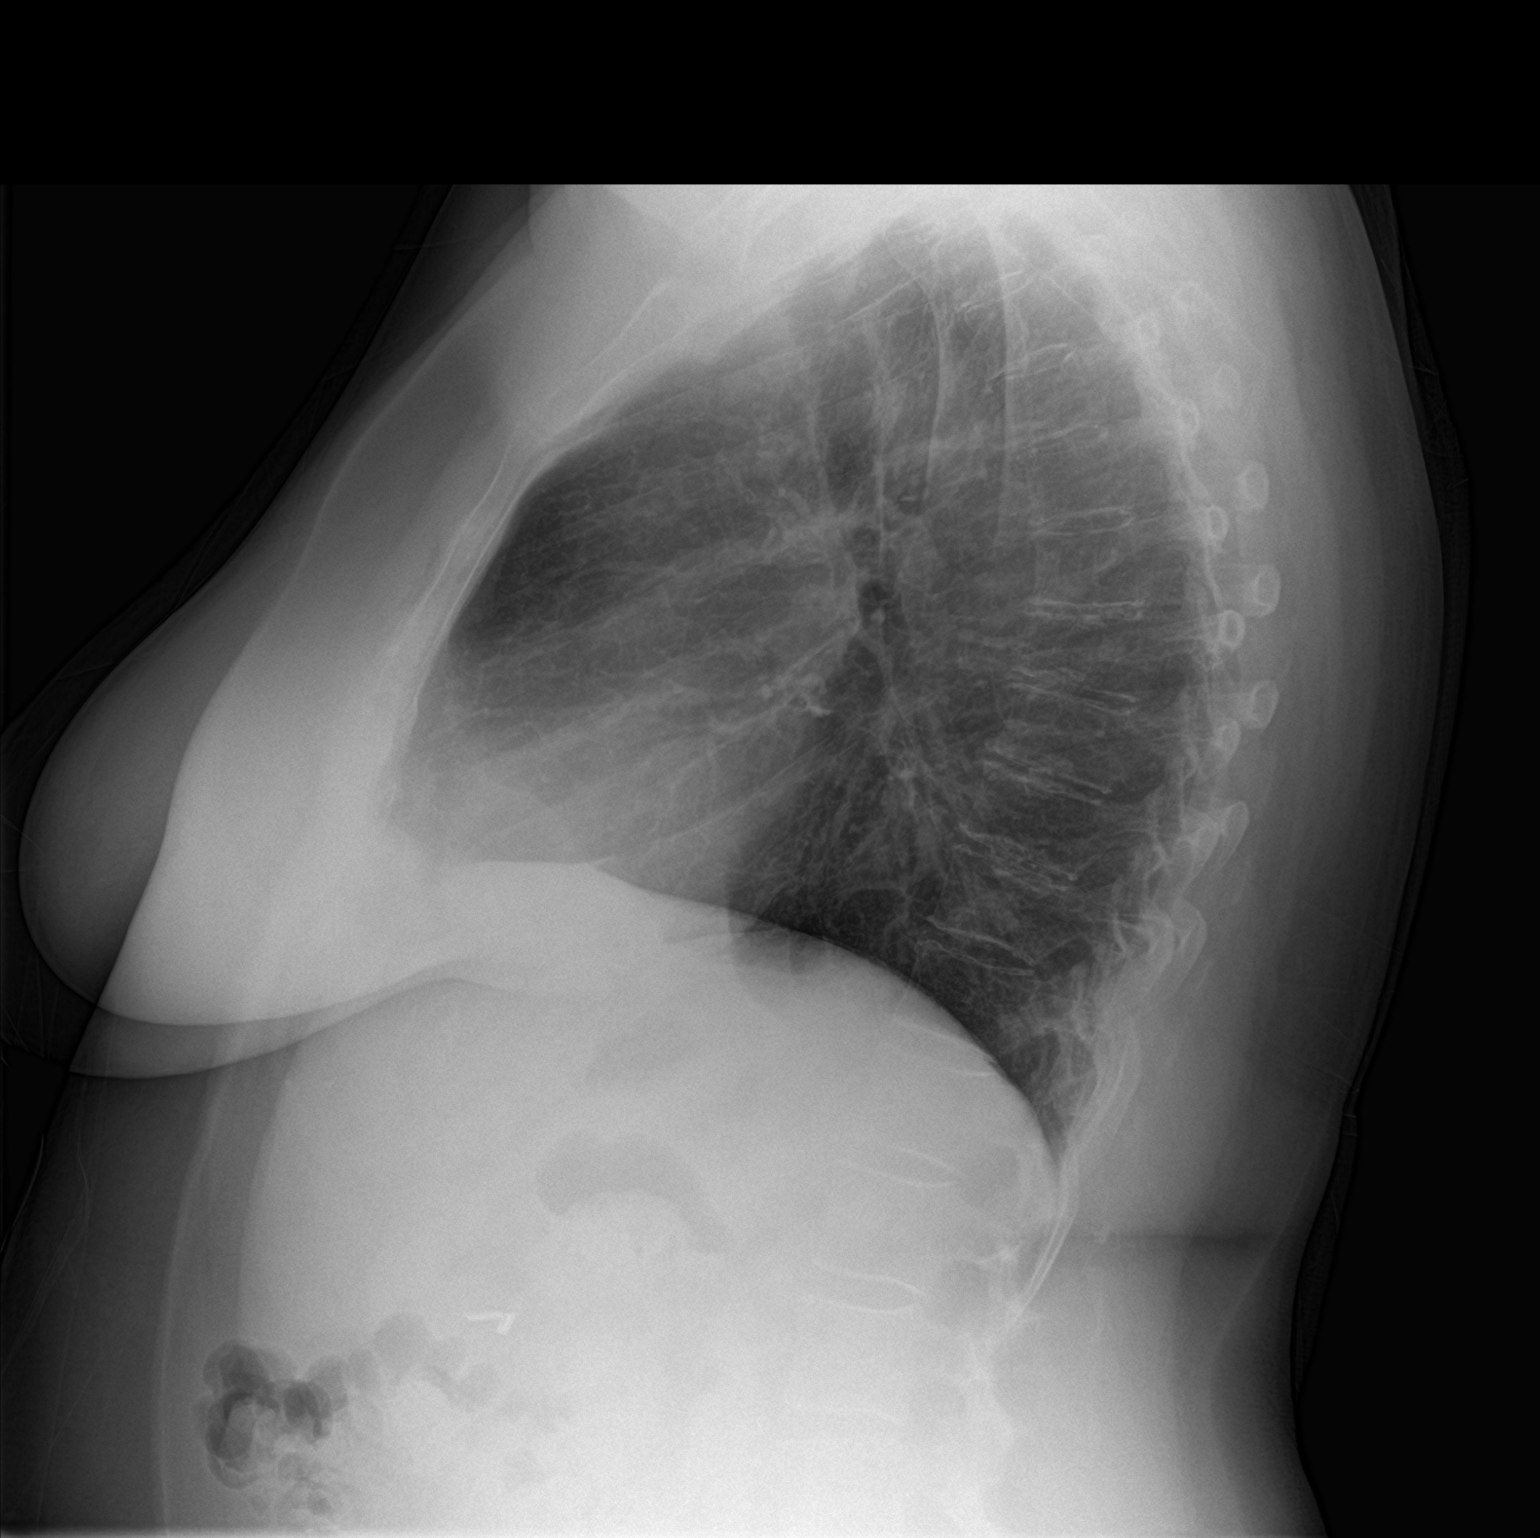

[2 of 2 positions shown; findings below may reference images not displayed]

FINDINGS: Clear lungs. No pneumothorax or pleural effusion. Cardiomediastinal
silhouette within normal limits.

No acute osseous abnormality.  Right upper quadrant surgical clips.
IMPRESSION: No focal airspace disease.

## 2021-02-02 DIAGNOSIS — G4733 Obstructive sleep apnea (adult) (pediatric): Secondary | ICD-10-CM | POA: Diagnosis not present

## 2021-02-02 DIAGNOSIS — R0600 Dyspnea, unspecified: Secondary | ICD-10-CM | POA: Diagnosis not present

## 2021-02-02 DIAGNOSIS — R5383 Other fatigue: Secondary | ICD-10-CM | POA: Diagnosis not present

## 2021-02-13 DIAGNOSIS — G4733 Obstructive sleep apnea (adult) (pediatric): Secondary | ICD-10-CM | POA: Diagnosis not present

## 2021-02-15 DIAGNOSIS — J449 Chronic obstructive pulmonary disease, unspecified: Secondary | ICD-10-CM | POA: Diagnosis not present

## 2021-02-15 DIAGNOSIS — E669 Obesity, unspecified: Secondary | ICD-10-CM | POA: Diagnosis not present

## 2021-02-15 DIAGNOSIS — R7303 Prediabetes: Secondary | ICD-10-CM | POA: Diagnosis not present

## 2021-02-15 DIAGNOSIS — M069 Rheumatoid arthritis, unspecified: Secondary | ICD-10-CM | POA: Diagnosis not present

## 2021-02-15 DIAGNOSIS — Z79899 Other long term (current) drug therapy: Secondary | ICD-10-CM | POA: Diagnosis not present

## 2021-02-15 DIAGNOSIS — Z6832 Body mass index (BMI) 32.0-32.9, adult: Secondary | ICD-10-CM | POA: Diagnosis not present

## 2021-02-15 DIAGNOSIS — E781 Pure hyperglyceridemia: Secondary | ICD-10-CM | POA: Diagnosis not present

## 2021-02-15 DIAGNOSIS — K219 Gastro-esophageal reflux disease without esophagitis: Secondary | ICD-10-CM | POA: Diagnosis not present

## 2021-02-15 DIAGNOSIS — Z1331 Encounter for screening for depression: Secondary | ICD-10-CM | POA: Diagnosis not present

## 2021-02-15 DIAGNOSIS — Z9181 History of falling: Secondary | ICD-10-CM | POA: Diagnosis not present

## 2021-02-21 DIAGNOSIS — G4733 Obstructive sleep apnea (adult) (pediatric): Secondary | ICD-10-CM | POA: Diagnosis not present

## 2021-03-12 DIAGNOSIS — J301 Allergic rhinitis due to pollen: Secondary | ICD-10-CM | POA: Diagnosis not present

## 2021-03-12 DIAGNOSIS — R0600 Dyspnea, unspecified: Secondary | ICD-10-CM | POA: Diagnosis not present

## 2021-03-20 DIAGNOSIS — R5383 Other fatigue: Secondary | ICD-10-CM | POA: Diagnosis not present

## 2021-03-20 DIAGNOSIS — G4733 Obstructive sleep apnea (adult) (pediatric): Secondary | ICD-10-CM | POA: Diagnosis not present

## 2021-03-20 DIAGNOSIS — R0600 Dyspnea, unspecified: Secondary | ICD-10-CM | POA: Diagnosis not present

## 2021-03-23 DIAGNOSIS — G4733 Obstructive sleep apnea (adult) (pediatric): Secondary | ICD-10-CM | POA: Diagnosis not present

## 2021-04-23 DIAGNOSIS — G4733 Obstructive sleep apnea (adult) (pediatric): Secondary | ICD-10-CM | POA: Diagnosis not present

## 2021-05-03 DIAGNOSIS — G4733 Obstructive sleep apnea (adult) (pediatric): Secondary | ICD-10-CM | POA: Diagnosis not present

## 2021-05-08 DIAGNOSIS — R5383 Other fatigue: Secondary | ICD-10-CM | POA: Diagnosis not present

## 2021-05-08 DIAGNOSIS — R0600 Dyspnea, unspecified: Secondary | ICD-10-CM | POA: Diagnosis not present

## 2021-05-08 DIAGNOSIS — G4733 Obstructive sleep apnea (adult) (pediatric): Secondary | ICD-10-CM | POA: Diagnosis not present

## 2021-05-23 DIAGNOSIS — G4733 Obstructive sleep apnea (adult) (pediatric): Secondary | ICD-10-CM | POA: Diagnosis not present

## 2021-06-23 DIAGNOSIS — G4733 Obstructive sleep apnea (adult) (pediatric): Secondary | ICD-10-CM | POA: Diagnosis not present

## 2021-07-24 DIAGNOSIS — G4733 Obstructive sleep apnea (adult) (pediatric): Secondary | ICD-10-CM | POA: Diagnosis not present

## 2021-07-31 DIAGNOSIS — Z87891 Personal history of nicotine dependence: Secondary | ICD-10-CM | POA: Diagnosis not present

## 2021-07-31 DIAGNOSIS — R5383 Other fatigue: Secondary | ICD-10-CM | POA: Diagnosis not present

## 2021-07-31 DIAGNOSIS — R0600 Dyspnea, unspecified: Secondary | ICD-10-CM | POA: Diagnosis not present

## 2021-07-31 DIAGNOSIS — G4733 Obstructive sleep apnea (adult) (pediatric): Secondary | ICD-10-CM | POA: Diagnosis not present

## 2021-08-03 ENCOUNTER — Telehealth: Payer: Self-pay | Admitting: Cardiology

## 2021-08-03 NOTE — Telephone Encounter (Signed)
Records faxed at this time

## 2021-08-03 NOTE — Telephone Encounter (Signed)
[facility] was requesting records for this patient. Please fax records to [phone] Attn: [employer]

## 2021-08-17 DIAGNOSIS — J069 Acute upper respiratory infection, unspecified: Secondary | ICD-10-CM | POA: Diagnosis not present

## 2021-08-17 DIAGNOSIS — E781 Pure hyperglyceridemia: Secondary | ICD-10-CM | POA: Diagnosis not present

## 2021-08-17 DIAGNOSIS — R7303 Prediabetes: Secondary | ICD-10-CM | POA: Diagnosis not present

## 2021-08-17 DIAGNOSIS — J309 Allergic rhinitis, unspecified: Secondary | ICD-10-CM | POA: Diagnosis not present

## 2021-08-17 DIAGNOSIS — K219 Gastro-esophageal reflux disease without esophagitis: Secondary | ICD-10-CM | POA: Diagnosis not present

## 2021-08-17 DIAGNOSIS — Z6834 Body mass index (BMI) 34.0-34.9, adult: Secondary | ICD-10-CM | POA: Diagnosis not present

## 2021-08-17 DIAGNOSIS — M069 Rheumatoid arthritis, unspecified: Secondary | ICD-10-CM | POA: Diagnosis not present

## 2021-08-17 DIAGNOSIS — M545 Low back pain, unspecified: Secondary | ICD-10-CM | POA: Diagnosis not present

## 2021-08-17 DIAGNOSIS — J449 Chronic obstructive pulmonary disease, unspecified: Secondary | ICD-10-CM | POA: Diagnosis not present

## 2021-08-23 DIAGNOSIS — G4733 Obstructive sleep apnea (adult) (pediatric): Secondary | ICD-10-CM | POA: Diagnosis not present

## 2021-08-31 DIAGNOSIS — I700 Atherosclerosis of aorta: Secondary | ICD-10-CM | POA: Diagnosis not present

## 2021-08-31 DIAGNOSIS — F17210 Nicotine dependence, cigarettes, uncomplicated: Secondary | ICD-10-CM | POA: Diagnosis not present

## 2021-08-31 DIAGNOSIS — Z87891 Personal history of nicotine dependence: Secondary | ICD-10-CM | POA: Diagnosis not present

## 2021-08-31 DIAGNOSIS — Z122 Encounter for screening for malignant neoplasm of respiratory organs: Secondary | ICD-10-CM | POA: Diagnosis not present

## 2021-09-05 DIAGNOSIS — G4733 Obstructive sleep apnea (adult) (pediatric): Secondary | ICD-10-CM | POA: Diagnosis not present

## 2021-09-07 DIAGNOSIS — G4733 Obstructive sleep apnea (adult) (pediatric): Secondary | ICD-10-CM | POA: Diagnosis not present

## 2021-09-07 DIAGNOSIS — R911 Solitary pulmonary nodule: Secondary | ICD-10-CM | POA: Diagnosis not present

## 2021-09-07 DIAGNOSIS — R0600 Dyspnea, unspecified: Secondary | ICD-10-CM | POA: Diagnosis not present

## 2021-09-07 DIAGNOSIS — R5383 Other fatigue: Secondary | ICD-10-CM | POA: Diagnosis not present

## 2021-11-07 DIAGNOSIS — G4733 Obstructive sleep apnea (adult) (pediatric): Secondary | ICD-10-CM | POA: Diagnosis not present

## 2022-02-05 DIAGNOSIS — G4733 Obstructive sleep apnea (adult) (pediatric): Secondary | ICD-10-CM | POA: Diagnosis not present

## 2022-03-18 DIAGNOSIS — H52223 Regular astigmatism, bilateral: Secondary | ICD-10-CM | POA: Diagnosis not present

## 2022-03-18 DIAGNOSIS — H2513 Age-related nuclear cataract, bilateral: Secondary | ICD-10-CM | POA: Diagnosis not present

## 2022-03-18 DIAGNOSIS — H5203 Hypermetropia, bilateral: Secondary | ICD-10-CM | POA: Diagnosis not present

## 2022-05-07 DIAGNOSIS — G4733 Obstructive sleep apnea (adult) (pediatric): Secondary | ICD-10-CM | POA: Diagnosis not present

## 2022-05-24 DIAGNOSIS — E781 Pure hyperglyceridemia: Secondary | ICD-10-CM | POA: Diagnosis not present

## 2022-05-24 DIAGNOSIS — K219 Gastro-esophageal reflux disease without esophagitis: Secondary | ICD-10-CM | POA: Diagnosis not present

## 2022-05-24 DIAGNOSIS — Z1331 Encounter for screening for depression: Secondary | ICD-10-CM | POA: Diagnosis not present

## 2022-05-24 DIAGNOSIS — M545 Low back pain, unspecified: Secondary | ICD-10-CM | POA: Diagnosis not present

## 2022-05-24 DIAGNOSIS — M8580 Other specified disorders of bone density and structure, unspecified site: Secondary | ICD-10-CM | POA: Diagnosis not present

## 2022-05-24 DIAGNOSIS — J309 Allergic rhinitis, unspecified: Secondary | ICD-10-CM | POA: Diagnosis not present

## 2022-05-24 DIAGNOSIS — J449 Chronic obstructive pulmonary disease, unspecified: Secondary | ICD-10-CM | POA: Diagnosis not present

## 2022-05-24 DIAGNOSIS — R7303 Prediabetes: Secondary | ICD-10-CM | POA: Diagnosis not present

## 2022-05-24 DIAGNOSIS — R5383 Other fatigue: Secondary | ICD-10-CM | POA: Diagnosis not present

## 2022-05-24 DIAGNOSIS — E782 Mixed hyperlipidemia: Secondary | ICD-10-CM | POA: Diagnosis not present

## 2022-05-24 DIAGNOSIS — M069 Rheumatoid arthritis, unspecified: Secondary | ICD-10-CM | POA: Diagnosis not present

## 2022-05-24 DIAGNOSIS — E2839 Other primary ovarian failure: Secondary | ICD-10-CM | POA: Diagnosis not present

## 2022-05-31 DIAGNOSIS — Z6836 Body mass index (BMI) 36.0-36.9, adult: Secondary | ICD-10-CM | POA: Diagnosis not present

## 2022-05-31 DIAGNOSIS — N611 Abscess of the breast and nipple: Secondary | ICD-10-CM | POA: Diagnosis not present

## 2022-06-04 DIAGNOSIS — R011 Cardiac murmur, unspecified: Secondary | ICD-10-CM | POA: Diagnosis not present

## 2022-06-04 DIAGNOSIS — L0390 Cellulitis, unspecified: Secondary | ICD-10-CM | POA: Diagnosis not present

## 2022-06-04 DIAGNOSIS — J309 Allergic rhinitis, unspecified: Secondary | ICD-10-CM | POA: Diagnosis not present

## 2022-06-04 DIAGNOSIS — M8580 Other specified disorders of bone density and structure, unspecified site: Secondary | ICD-10-CM | POA: Diagnosis not present

## 2022-06-04 DIAGNOSIS — G4733 Obstructive sleep apnea (adult) (pediatric): Secondary | ICD-10-CM | POA: Diagnosis not present

## 2022-06-04 DIAGNOSIS — E662 Morbid (severe) obesity with alveolar hypoventilation: Secondary | ICD-10-CM | POA: Diagnosis not present

## 2022-06-04 DIAGNOSIS — J449 Chronic obstructive pulmonary disease, unspecified: Secondary | ICD-10-CM | POA: Diagnosis not present

## 2022-06-04 DIAGNOSIS — J45909 Unspecified asthma, uncomplicated: Secondary | ICD-10-CM | POA: Diagnosis not present

## 2022-06-04 DIAGNOSIS — H259 Unspecified age-related cataract: Secondary | ICD-10-CM | POA: Diagnosis not present

## 2022-06-04 DIAGNOSIS — E669 Obesity, unspecified: Secondary | ICD-10-CM | POA: Diagnosis not present

## 2022-06-04 DIAGNOSIS — K219 Gastro-esophageal reflux disease without esophagitis: Secondary | ICD-10-CM | POA: Diagnosis not present

## 2022-06-04 DIAGNOSIS — M1990 Unspecified osteoarthritis, unspecified site: Secondary | ICD-10-CM | POA: Diagnosis not present

## 2022-08-08 DIAGNOSIS — G4733 Obstructive sleep apnea (adult) (pediatric): Secondary | ICD-10-CM | POA: Diagnosis not present

## 2022-08-09 DIAGNOSIS — E2839 Other primary ovarian failure: Secondary | ICD-10-CM | POA: Diagnosis not present

## 2022-08-09 DIAGNOSIS — M8589 Other specified disorders of bone density and structure, multiple sites: Secondary | ICD-10-CM | POA: Diagnosis not present

## 2022-08-09 DIAGNOSIS — Z1231 Encounter for screening mammogram for malignant neoplasm of breast: Secondary | ICD-10-CM | POA: Diagnosis not present

## 2022-08-13 DIAGNOSIS — D751 Secondary polycythemia: Secondary | ICD-10-CM | POA: Diagnosis not present

## 2022-09-02 DIAGNOSIS — Z122 Encounter for screening for malignant neoplasm of respiratory organs: Secondary | ICD-10-CM | POA: Diagnosis not present

## 2022-09-02 DIAGNOSIS — Z87891 Personal history of nicotine dependence: Secondary | ICD-10-CM | POA: Diagnosis not present

## 2022-09-06 DIAGNOSIS — G4733 Obstructive sleep apnea (adult) (pediatric): Secondary | ICD-10-CM | POA: Diagnosis not present

## 2022-09-06 DIAGNOSIS — R5383 Other fatigue: Secondary | ICD-10-CM | POA: Diagnosis not present

## 2022-09-06 DIAGNOSIS — R0600 Dyspnea, unspecified: Secondary | ICD-10-CM | POA: Diagnosis not present

## 2022-09-06 DIAGNOSIS — R911 Solitary pulmonary nodule: Secondary | ICD-10-CM | POA: Diagnosis not present

## 2022-11-08 DIAGNOSIS — G4733 Obstructive sleep apnea (adult) (pediatric): Secondary | ICD-10-CM | POA: Diagnosis not present

## 2022-11-13 DIAGNOSIS — G4733 Obstructive sleep apnea (adult) (pediatric): Secondary | ICD-10-CM | POA: Diagnosis not present

## 2022-11-20 DIAGNOSIS — R0600 Dyspnea, unspecified: Secondary | ICD-10-CM | POA: Diagnosis not present

## 2022-11-20 DIAGNOSIS — R911 Solitary pulmonary nodule: Secondary | ICD-10-CM | POA: Diagnosis not present

## 2022-11-20 DIAGNOSIS — R5383 Other fatigue: Secondary | ICD-10-CM | POA: Diagnosis not present

## 2022-11-20 DIAGNOSIS — G4733 Obstructive sleep apnea (adult) (pediatric): Secondary | ICD-10-CM | POA: Diagnosis not present

## 2022-11-29 DIAGNOSIS — M545 Low back pain, unspecified: Secondary | ICD-10-CM | POA: Diagnosis not present

## 2022-11-29 DIAGNOSIS — R7303 Prediabetes: Secondary | ICD-10-CM | POA: Diagnosis not present

## 2022-11-29 DIAGNOSIS — M069 Rheumatoid arthritis, unspecified: Secondary | ICD-10-CM | POA: Diagnosis not present

## 2022-11-29 DIAGNOSIS — E782 Mixed hyperlipidemia: Secondary | ICD-10-CM | POA: Diagnosis not present

## 2022-11-29 DIAGNOSIS — J309 Allergic rhinitis, unspecified: Secondary | ICD-10-CM | POA: Diagnosis not present

## 2022-11-29 DIAGNOSIS — J449 Chronic obstructive pulmonary disease, unspecified: Secondary | ICD-10-CM | POA: Diagnosis not present

## 2022-11-29 DIAGNOSIS — K219 Gastro-esophageal reflux disease without esophagitis: Secondary | ICD-10-CM | POA: Diagnosis not present

## 2023-01-01 DIAGNOSIS — R0600 Dyspnea, unspecified: Secondary | ICD-10-CM | POA: Diagnosis not present

## 2023-01-01 DIAGNOSIS — E559 Vitamin D deficiency, unspecified: Secondary | ICD-10-CM | POA: Diagnosis not present

## 2023-01-01 DIAGNOSIS — R5383 Other fatigue: Secondary | ICD-10-CM | POA: Diagnosis not present

## 2023-01-01 DIAGNOSIS — G4733 Obstructive sleep apnea (adult) (pediatric): Secondary | ICD-10-CM | POA: Diagnosis not present

## 2023-01-01 DIAGNOSIS — R911 Solitary pulmonary nodule: Secondary | ICD-10-CM | POA: Diagnosis not present

## 2023-01-01 DIAGNOSIS — E539 Vitamin B deficiency, unspecified: Secondary | ICD-10-CM | POA: Diagnosis not present

## 2023-01-14 DIAGNOSIS — Z87891 Personal history of nicotine dependence: Secondary | ICD-10-CM | POA: Diagnosis not present

## 2023-01-14 DIAGNOSIS — I429 Cardiomyopathy, unspecified: Secondary | ICD-10-CM | POA: Diagnosis not present

## 2023-01-14 DIAGNOSIS — E669 Obesity, unspecified: Secondary | ICD-10-CM | POA: Diagnosis not present

## 2023-01-14 DIAGNOSIS — K219 Gastro-esophageal reflux disease without esophagitis: Secondary | ICD-10-CM | POA: Diagnosis not present

## 2023-01-14 DIAGNOSIS — J449 Chronic obstructive pulmonary disease, unspecified: Secondary | ICD-10-CM | POA: Diagnosis not present

## 2023-01-14 DIAGNOSIS — J309 Allergic rhinitis, unspecified: Secondary | ICD-10-CM | POA: Diagnosis not present

## 2023-01-14 DIAGNOSIS — R011 Cardiac murmur, unspecified: Secondary | ICD-10-CM | POA: Diagnosis not present

## 2023-01-14 DIAGNOSIS — G4733 Obstructive sleep apnea (adult) (pediatric): Secondary | ICD-10-CM | POA: Diagnosis not present

## 2023-01-14 DIAGNOSIS — M8580 Other specified disorders of bone density and structure, unspecified site: Secondary | ICD-10-CM | POA: Diagnosis not present

## 2023-01-14 DIAGNOSIS — J45909 Unspecified asthma, uncomplicated: Secondary | ICD-10-CM | POA: Diagnosis not present

## 2023-01-14 DIAGNOSIS — M1990 Unspecified osteoarthritis, unspecified site: Secondary | ICD-10-CM | POA: Diagnosis not present

## 2023-02-10 DIAGNOSIS — G4733 Obstructive sleep apnea (adult) (pediatric): Secondary | ICD-10-CM | POA: Diagnosis not present

## 2023-08-20 DIAGNOSIS — G4733 Obstructive sleep apnea (adult) (pediatric): Secondary | ICD-10-CM | POA: Diagnosis not present

## 2023-10-09 DIAGNOSIS — Z1231 Encounter for screening mammogram for malignant neoplasm of breast: Secondary | ICD-10-CM | POA: Diagnosis not present

## 2023-11-07 DIAGNOSIS — Z1231 Encounter for screening mammogram for malignant neoplasm of breast: Secondary | ICD-10-CM | POA: Diagnosis not present

## 2023-11-20 DIAGNOSIS — G4733 Obstructive sleep apnea (adult) (pediatric): Secondary | ICD-10-CM | POA: Diagnosis not present

## 2024-02-18 DIAGNOSIS — Z0000 Encounter for general adult medical examination without abnormal findings: Secondary | ICD-10-CM | POA: Diagnosis not present

## 2024-02-18 DIAGNOSIS — K219 Gastro-esophageal reflux disease without esophagitis: Secondary | ICD-10-CM | POA: Diagnosis not present

## 2024-02-18 DIAGNOSIS — E782 Mixed hyperlipidemia: Secondary | ICD-10-CM | POA: Diagnosis not present

## 2024-02-18 DIAGNOSIS — G4733 Obstructive sleep apnea (adult) (pediatric): Secondary | ICD-10-CM | POA: Diagnosis not present

## 2024-02-18 DIAGNOSIS — R7303 Prediabetes: Secondary | ICD-10-CM | POA: Diagnosis not present

## 2024-02-18 DIAGNOSIS — Z1331 Encounter for screening for depression: Secondary | ICD-10-CM | POA: Diagnosis not present

## 2024-02-18 DIAGNOSIS — Z23 Encounter for immunization: Secondary | ICD-10-CM | POA: Diagnosis not present

## 2024-02-18 DIAGNOSIS — J309 Allergic rhinitis, unspecified: Secondary | ICD-10-CM | POA: Diagnosis not present

## 2024-02-18 DIAGNOSIS — M8580 Other specified disorders of bone density and structure, unspecified site: Secondary | ICD-10-CM | POA: Diagnosis not present

## 2024-02-18 DIAGNOSIS — M545 Low back pain, unspecified: Secondary | ICD-10-CM | POA: Diagnosis not present

## 2024-02-18 DIAGNOSIS — M069 Rheumatoid arthritis, unspecified: Secondary | ICD-10-CM | POA: Diagnosis not present

## 2024-02-18 DIAGNOSIS — J449 Chronic obstructive pulmonary disease, unspecified: Secondary | ICD-10-CM | POA: Diagnosis not present

## 2024-02-18 DIAGNOSIS — Z9181 History of falling: Secondary | ICD-10-CM | POA: Diagnosis not present

## 2024-04-05 DIAGNOSIS — R350 Frequency of micturition: Secondary | ICD-10-CM | POA: Diagnosis not present

## 2024-04-05 DIAGNOSIS — N763 Subacute and chronic vulvitis: Secondary | ICD-10-CM | POA: Diagnosis not present

## 2024-04-05 DIAGNOSIS — N952 Postmenopausal atrophic vaginitis: Secondary | ICD-10-CM | POA: Diagnosis not present

## 2024-05-19 DIAGNOSIS — G4733 Obstructive sleep apnea (adult) (pediatric): Secondary | ICD-10-CM | POA: Diagnosis not present

## 2024-06-09 DIAGNOSIS — J309 Allergic rhinitis, unspecified: Secondary | ICD-10-CM | POA: Diagnosis not present

## 2024-06-09 DIAGNOSIS — J449 Chronic obstructive pulmonary disease, unspecified: Secondary | ICD-10-CM | POA: Diagnosis not present

## 2024-06-09 DIAGNOSIS — Z79899 Other long term (current) drug therapy: Secondary | ICD-10-CM | POA: Diagnosis not present

## 2024-06-09 DIAGNOSIS — K219 Gastro-esophageal reflux disease without esophagitis: Secondary | ICD-10-CM | POA: Diagnosis not present

## 2024-06-09 DIAGNOSIS — M545 Low back pain, unspecified: Secondary | ICD-10-CM | POA: Diagnosis not present

## 2024-06-09 DIAGNOSIS — M8580 Other specified disorders of bone density and structure, unspecified site: Secondary | ICD-10-CM | POA: Diagnosis not present

## 2024-06-09 DIAGNOSIS — L989 Disorder of the skin and subcutaneous tissue, unspecified: Secondary | ICD-10-CM | POA: Diagnosis not present

## 2024-06-09 DIAGNOSIS — J4540 Moderate persistent asthma, uncomplicated: Secondary | ICD-10-CM | POA: Diagnosis not present

## 2024-06-09 DIAGNOSIS — E782 Mixed hyperlipidemia: Secondary | ICD-10-CM | POA: Diagnosis not present

## 2024-06-09 DIAGNOSIS — R7303 Prediabetes: Secondary | ICD-10-CM | POA: Diagnosis not present

## 2024-06-09 DIAGNOSIS — I429 Cardiomyopathy, unspecified: Secondary | ICD-10-CM | POA: Diagnosis not present

## 2024-06-09 DIAGNOSIS — M069 Rheumatoid arthritis, unspecified: Secondary | ICD-10-CM | POA: Diagnosis not present

## 2024-06-18 DIAGNOSIS — K760 Fatty (change of) liver, not elsewhere classified: Secondary | ICD-10-CM | POA: Diagnosis not present

## 2024-06-18 DIAGNOSIS — R7989 Other specified abnormal findings of blood chemistry: Secondary | ICD-10-CM | POA: Diagnosis not present

## 2024-06-18 DIAGNOSIS — J439 Emphysema, unspecified: Secondary | ICD-10-CM | POA: Diagnosis not present

## 2024-06-18 DIAGNOSIS — Z122 Encounter for screening for malignant neoplasm of respiratory organs: Secondary | ICD-10-CM | POA: Diagnosis not present

## 2024-06-18 DIAGNOSIS — Z87891 Personal history of nicotine dependence: Secondary | ICD-10-CM | POA: Diagnosis not present

## 2024-06-18 DIAGNOSIS — Z905 Acquired absence of kidney: Secondary | ICD-10-CM | POA: Diagnosis not present

## 2024-06-18 DIAGNOSIS — R911 Solitary pulmonary nodule: Secondary | ICD-10-CM | POA: Diagnosis not present

## 2024-06-20 DIAGNOSIS — Z9049 Acquired absence of other specified parts of digestive tract: Secondary | ICD-10-CM | POA: Diagnosis not present

## 2024-06-20 DIAGNOSIS — R188 Other ascites: Secondary | ICD-10-CM | POA: Diagnosis not present

## 2024-06-20 DIAGNOSIS — K760 Fatty (change of) liver, not elsewhere classified: Secondary | ICD-10-CM | POA: Diagnosis not present

## 2024-06-20 DIAGNOSIS — Z87891 Personal history of nicotine dependence: Secondary | ICD-10-CM | POA: Diagnosis not present

## 2024-06-20 DIAGNOSIS — R945 Abnormal results of liver function studies: Secondary | ICD-10-CM | POA: Diagnosis not present
